# Patient Record
Sex: Female | Born: 1980 | Race: Black or African American | Hispanic: No | Marital: Single | Smoking: Current every day smoker
Health system: Southern US, Community
[De-identification: ages and names within clinical notes are randomized; demographics above are authoritative.]

## PROBLEM LIST (undated history)

## (undated) DIAGNOSIS — J189 Pneumonia, unspecified organism: Secondary | ICD-10-CM

## (undated) DIAGNOSIS — J939 Pneumothorax, unspecified: Secondary | ICD-10-CM

## (undated) DIAGNOSIS — Z9889 Other specified postprocedural states: Secondary | ICD-10-CM

## (undated) DIAGNOSIS — J9819 Other pulmonary collapse: Secondary | ICD-10-CM

## (undated) HISTORY — PX: CHEST TUBE INSERTION: SHX231

---

## 2015-07-08 DIAGNOSIS — Z8709 Personal history of other diseases of the respiratory system: Secondary | ICD-10-CM | POA: Insufficient documentation

## 2015-07-08 DIAGNOSIS — F17210 Nicotine dependence, cigarettes, uncomplicated: Secondary | ICD-10-CM | POA: Insufficient documentation

## 2015-07-08 DIAGNOSIS — D649 Anemia, unspecified: Secondary | ICD-10-CM | POA: Insufficient documentation

## 2015-07-08 DIAGNOSIS — E669 Obesity, unspecified: Secondary | ICD-10-CM | POA: Insufficient documentation

## 2015-07-08 DIAGNOSIS — F411 Generalized anxiety disorder: Secondary | ICD-10-CM | POA: Insufficient documentation

## 2015-11-04 DIAGNOSIS — R0789 Other chest pain: Secondary | ICD-10-CM | POA: Insufficient documentation

## 2015-11-04 DIAGNOSIS — I10 Essential (primary) hypertension: Secondary | ICD-10-CM | POA: Insufficient documentation

## 2018-01-25 DIAGNOSIS — S93432A Sprain of tibiofibular ligament of left ankle, initial encounter: Secondary | ICD-10-CM | POA: Insufficient documentation

## 2018-01-25 DIAGNOSIS — S82852A Displaced trimalleolar fracture of left lower leg, initial encounter for closed fracture: Secondary | ICD-10-CM | POA: Insufficient documentation

## 2019-05-14 ENCOUNTER — Emergency Department (HOSPITAL_COMMUNITY): Payer: Self-pay

## 2019-05-14 ENCOUNTER — Emergency Department (HOSPITAL_COMMUNITY)
Admission: EM | Admit: 2019-05-14 | Discharge: 2019-05-14 | Disposition: A | Discharge status: Home / Self Care | Payer: Self-pay | Attending: Emergency Medicine | Admitting: Emergency Medicine

## 2019-05-14 ENCOUNTER — Encounter (HOSPITAL_COMMUNITY): Payer: Self-pay | Admitting: *Deleted

## 2019-05-14 ENCOUNTER — Other Ambulatory Visit: Payer: Self-pay

## 2019-05-14 DIAGNOSIS — F17210 Nicotine dependence, cigarettes, uncomplicated: Secondary | ICD-10-CM | POA: Insufficient documentation

## 2019-05-14 DIAGNOSIS — I10 Essential (primary) hypertension: Secondary | ICD-10-CM | POA: Insufficient documentation

## 2019-05-14 DIAGNOSIS — Z8701 Personal history of pneumonia (recurrent): Secondary | ICD-10-CM | POA: Insufficient documentation

## 2019-05-14 DIAGNOSIS — R0789 Other chest pain: Secondary | ICD-10-CM | POA: Insufficient documentation

## 2019-05-14 DIAGNOSIS — Z20822 Contact with and (suspected) exposure to covid-19: Secondary | ICD-10-CM | POA: Insufficient documentation

## 2019-05-14 DIAGNOSIS — R0602 Shortness of breath: Secondary | ICD-10-CM | POA: Insufficient documentation

## 2019-05-14 HISTORY — DX: Pneumonia, unspecified organism: J18.9

## 2019-05-14 LAB — CBC WITH DIFFERENTIAL/PLATELET
Abs Immature Granulocytes: 0.04 10*3/uL (ref 0.00–0.07)
Basophils Absolute: 0.1 10*3/uL (ref 0.0–0.1)
Basophils Relative: 1 %
Eosinophils Absolute: 0.3 10*3/uL (ref 0.0–0.5)
Eosinophils Relative: 3 %
HCT: 41.4 % (ref 36.0–46.0)
Hemoglobin: 12.6 g/dL (ref 12.0–15.0)
Immature Granulocytes: 0 %
Lymphocytes Relative: 26 %
Lymphs Abs: 3.1 10*3/uL (ref 0.7–4.0)
MCH: 24.5 pg — ABNORMAL LOW (ref 26.0–34.0)
MCHC: 30.4 g/dL (ref 30.0–36.0)
MCV: 80.4 fL (ref 80.0–100.0)
Monocytes Absolute: 1.5 10*3/uL — ABNORMAL HIGH (ref 0.1–1.0)
Monocytes Relative: 12 %
Neutro Abs: 7.1 10*3/uL (ref 1.7–7.7)
Neutrophils Relative %: 58 %
Platelets: 349 10*3/uL (ref 150–400)
RBC: 5.15 MIL/uL — ABNORMAL HIGH (ref 3.87–5.11)
RDW: 17.9 % — ABNORMAL HIGH (ref 11.5–15.5)
WBC: 12.1 10*3/uL — ABNORMAL HIGH (ref 4.0–10.5)
nRBC: 0 % (ref 0.0–0.2)

## 2019-05-14 LAB — BASIC METABOLIC PANEL
Anion gap: 9 (ref 5–15)
BUN: 13 mg/dL (ref 6–20)
CO2: 26 mmol/L (ref 22–32)
Calcium: 9.2 mg/dL (ref 8.9–10.3)
Chloride: 103 mmol/L (ref 98–111)
Creatinine, Ser: 0.84 mg/dL (ref 0.44–1.00)
GFR calc Af Amer: >60 mL/min (ref >60)
GFR calc non Af Amer: >60 mL/min (ref >60)
Glucose, Bld: 85 mg/dL (ref 70–99)
Potassium: 3.9 mmol/L (ref 3.5–5.1)
Sodium: 138 mmol/L (ref 135–145)

## 2019-05-14 LAB — I-STAT BETA HCG BLOOD, ED (MC, WL, AP ONLY): I-stat hCG, quantitative: <5 m[IU]/mL (ref <5)

## 2019-05-14 LAB — TROPONIN I (HIGH SENSITIVITY): Troponin I (High Sensitivity): 5 ng/L (ref <18)

## 2019-05-14 LAB — BRAIN NATRIURETIC PEPTIDE: B Natriuretic Peptide: 40 pg/mL (ref 0.0–100.0)

## 2019-05-14 MED ORDER — MORPHINE SULFATE (PF) 4 MG/ML IV SOLN
4.0000 mg | Freq: Once | INTRAVENOUS | Status: AC
  Administered 2019-05-14: 4 mg via INTRAVENOUS
  Filled 2019-05-14: qty 1

## 2019-05-14 MED ORDER — SODIUM CHLORIDE 0.9 % IV BOLUS: 1000.0000 mL | Freq: Once | INTRAVENOUS | Status: DC

## 2019-05-14 MED ORDER — DOXYCYCLINE HYCLATE 100 MG PO CAPS: 100.0000 mg | ORAL_CAPSULE | Freq: Two times a day (BID) | ORAL | 0 refills | Status: DC

## 2019-05-14 MED ORDER — TRAMADOL HCL 50 MG PO TABS: 50.0000 mg | ORAL_TABLET | Freq: Four times a day (QID) | ORAL | 0 refills | Status: DC | PRN

## 2019-05-14 MED ORDER — ALBUTEROL SULFATE (2.5 MG/3ML) 0.083% IN NEBU: 5.0000 mg | INHALATION_SOLUTION | Freq: Once | RESPIRATORY_TRACT | Status: DC

## 2019-05-14 NOTE — ED Triage Notes (Signed)
Pt dx with bil Pneumonia a month ago, has taken antibiotics without note improvement, last few days noted to be worsening. Today felt as if heart was racing

## 2019-05-14 NOTE — Discharge Instructions (Signed)
Continue albuterol as needed for shortness of breath and wheezing Take Doxycycline twice daily for one week Take Tramadol as needed for pain Please quarantine until you get the results of your COVID test which could take 2-3 days Follow up with [facility] Please return if worsening

## 2019-05-14 NOTE — ED Provider Notes (Signed)
[facility]-EMERGENCY DEPT Provider Note   CSN: [id number] Arrival date & time: 05/14/19  1727     History Chief Complaint  Patient presents with  . Shortness of Breath    Suzanne Morrison is a 39 y.o. female with history of HTN, hx of pneumothorax and empyema requiring chest tube and VATs in 2017, smoking who presents with SOB.  Patient states that for the past month she has had gradually worsening shortness of breath.  Shortness of breath is all the time. Occasional wheezing. No significant cough. She reports associated chest soreness over the central part of her chest and over the bilateral ribs.  She is also experiencing upper back pain.  She states the pain feels like when she has had pneumonia in the past however she went to [facility] on 03/22/19 and was told she had a multifocal pneumonia and possible enlarged heart.  She was given a Z-Pak and states that she took the full course but never felt like she got better.  She continues to smoke cigarettes but states she only smokes 1-2 a day.  Covid test was sent off and was negative.  She uses albuterol as needed for shortness of breath which sometimes helps but also causes palpitations and heart racing and this is what caused her to come to the ED tonight.  She does not have a primary care doctor. No recent surgery/travel/immobilization, hx of cancer, leg swelling, hemoptysis, prior DVT/PE, or hormone use.    HPI     Past Medical History:  Diagnosis Date  . Pneumonia     There are no problems to display for this patient.   Past Surgical History:  Procedure Laterality Date  . CHEST TUBE INSERTION       OB History   No obstetric history on file.     No family history on file.  Social History   Tobacco Use  . Smoking status: Current Every Day Smoker  . Smokeless tobacco: Never Used  Substance Use Topics  . Alcohol use: Yes  . Drug use: Never    Home Medications Prior to Admission medications   Not  on File    Allergies    Patient has no known allergies.  Review of Systems   Review of Systems  Constitutional: Negative for chills and fever.  Respiratory: Positive for shortness of breath. Negative for cough and wheezing.   Cardiovascular: Positive for chest pain.  Gastrointestinal: Negative for abdominal pain, nausea and vomiting.  Genitourinary: Negative for dysuria.  Musculoskeletal: Positive for back pain.  Neurological: Negative for light-headedness and headaches.  All other systems reviewed and are negative.   Physical Exam Updated Vital Signs BP (!) 142/90 (BP Location: Left Arm)   Pulse 95   Temp 97.9 F (36.6 C) (Oral)   Resp (!) 25   Ht 6\' 1"  (1.854 m)   Wt 111.1 kg   SpO2 100%   BMI 32.32 kg/m   Physical Exam Vitals and nursing note reviewed.  Constitutional:      General: She is not in acute distress.    Appearance: She is well-developed. She is not ill-appearing.     Comments: Cooperative. NAD. Tearful  HENT:     Head: Normocephalic and atraumatic.  Eyes:     General: No scleral icterus.       Right eye: No discharge.        Left eye: No discharge.     Conjunctiva/sclera: Conjunctivae normal.     Pupils: Pupils  are equal, round, and reactive to light.  Cardiovascular:     Rate and Rhythm: Normal rate and regular rhythm.  Pulmonary:     Effort: Pulmonary effort is normal. No respiratory distress.     Breath sounds: Normal breath sounds.  Abdominal:     General: There is no distension.  Musculoskeletal:     Cervical back: Normal range of motion.     Right lower leg: No edema.     Left lower leg: No edema.  Skin:    General: Skin is warm and dry.  Neurological:     Mental Status: She is alert and oriented to person, place, and time.  Psychiatric:        Behavior: Behavior normal.     ED Results / Procedures / Treatments   Labs (all labs ordered are listed, but only abnormal results are displayed) Labs Reviewed  CBC WITH  DIFFERENTIAL/PLATELET - Abnormal; Notable for the following components:      Result Value   WBC 12.1 (*)    RBC 5.15 (*)    MCH 24.5 (*)    RDW 17.9 (*)    Monocytes Absolute 1.5 (*)    All other components within normal limits  NOVEL CORONAVIRUS, NAA (HOSP ORDER, SEND-OUT TO REF LAB; TAT 18-24 HRS)  BASIC METABOLIC PANEL  BRAIN NATRIURETIC PEPTIDE  I-STAT BETA HCG BLOOD, ED ([facility], [facility], [facility] ONLY)  TROPONIN I (HIGH SENSITIVITY)  TROPONIN I (HIGH SENSITIVITY)    EKG EKG Interpretation  Date/Time:  Sunday May 14 2019 18:28:54 EST Ventricular Rate:  89 PR Interval:    QRS Duration: 85 QT Interval:  367 QTC Calculation: 447 R Axis:   -5 Text Interpretation: Sinus rhythm Probable left atrial enlargement RSR' in V1 or V2, probably normal variant Confirmed by Virgina Norfolk [phone]) on 05/14/2019 6:35:00 PM   Radiology DG Chest 2 View  Result Date: 05/14/2019 CLINICAL DATA:  Shortness of breath. EXAM: CHEST - 2 VIEW COMPARISON:  None. FINDINGS: The heart size and mediastinal contours are within normal limits. Both lungs are clear. There is mild to moderate severity dextroscoliosis of the lower thoracic spine. IMPRESSION: No active cardiopulmonary disease. Electronically Signed   By: Aram Candela M.D.   On: 05/14/2019 18:40    Procedures Procedures (including critical care time)  Medications Ordered in ED Medications  morphine 4 MG/ML injection 4 mg (4 mg Intravenous Given 05/14/19 2114)    ED Course  I have reviewed the triage vital signs and the nursing notes.  Pertinent labs & imaging results that were available during my care of the patient were reviewed by me and considered in my medical decision making (see chart for details).  39 year old female presents with over one month of SOB and chest "soreness". She states it feels like when she has had pneumonia in the past. She was treated for CAP with a Z-pack last month and she reports no improvement. She is an everyday  smoker as well. BP here is elevated but otherwise vitals are reassuring. EKG was obtained and is SR. Due to prolonged symptoms will obtain labs, CXR.  CBC is remarkable for mild leukocytosis (12.1). BMP is normal. BNP is normal. Initial trop is 5. CXR is negative. Due to unrevealing CXR and hx of recurrent bronchitis and CAP will obtain CT   Nursing has notified me that the patient would like to leave. I went to talk to her and she states that her mom is waiting in the car  and she feels like she can get work up done as an outpatient. She is asking for COVID testing which we will obtain but I don't have a high suspicion for this. She doesn't have a PCP and was given information for [facility]. Since she feels symptoms are the same as when she's had pneumonia and she has a leukocytosis will treat clinically with Doxy. She is also requesting pain control - will give Tramadol. She has an inhaler at home to use as needed. Advised return if worsening.  Suzanne Morrison was evaluated in Emergency Department on 05/15/2019 for the symptoms described in the history of present illness. She was evaluated in the context of the global COVID-19 pandemic, which necessitated consideration that the patient might be at risk for infection with the SARS-CoV-2 virus that causes COVID-19. Institutional protocols and algorithms that pertain to the evaluation of patients at risk for COVID-19 are in a state of rapid change based on information released by regulatory bodies including the CDC and federal and state organizations. These policies and algorithms were followed during the patient's care in the ED.   MDM Rules/Calculators/A&P                       Final Clinical Impression(s) / ED Diagnoses Final diagnoses:  Shortness of breath  Atypical chest pain    Rx / DC Orders ED Discharge Orders    None       Recardo Evangelist, PA-C 05/15/19 2052    Lennice Sites, DO 05/17/19 2035

## 2019-05-15 ENCOUNTER — Telehealth: Payer: Self-pay | Admitting: *Deleted

## 2019-05-15 NOTE — Telephone Encounter (Signed)
Pt called to check the status of her covid 19 test, that was done in the hospital. She is advised that it has not resulted. She voiced understanding.

## 2019-05-16 LAB — NOVEL CORONAVIRUS, NAA (HOSP ORDER, SEND-OUT TO REF LAB; TAT 18-24 HRS): SARS-CoV-2, NAA: NOT DETECTED

## 2019-11-17 ENCOUNTER — Emergency Department (HOSPITAL_COMMUNITY): Admission: EM | Admit: 2019-11-17 | Discharge: 2019-11-17 | Discharge status: Against Medical Advice | Payer: Self-pay

## 2019-11-17 NOTE — ED Notes (Signed)
Eloped from waiting area prior to triage.  

## 2020-01-22 ENCOUNTER — Emergency Department (HOSPITAL_COMMUNITY)
Admission: EM | Admit: 2020-01-22 | Discharge: 2020-01-22 | Disposition: A | Discharge status: Home / Self Care | Payer: Self-pay | Attending: Emergency Medicine | Admitting: Emergency Medicine

## 2020-01-22 ENCOUNTER — Encounter (HOSPITAL_COMMUNITY): Payer: Self-pay | Admitting: Emergency Medicine

## 2020-01-22 ENCOUNTER — Other Ambulatory Visit: Payer: Self-pay

## 2020-01-22 ENCOUNTER — Emergency Department (HOSPITAL_COMMUNITY): Payer: Self-pay

## 2020-01-22 DIAGNOSIS — R079 Chest pain, unspecified: Secondary | ICD-10-CM

## 2020-01-22 DIAGNOSIS — F17210 Nicotine dependence, cigarettes, uncomplicated: Secondary | ICD-10-CM | POA: Insufficient documentation

## 2020-01-22 DIAGNOSIS — R072 Precordial pain: Secondary | ICD-10-CM | POA: Insufficient documentation

## 2020-01-22 HISTORY — DX: Pneumothorax, unspecified: J93.9

## 2020-01-22 LAB — BASIC METABOLIC PANEL
Anion gap: 7 (ref 5–15)
BUN: 7 mg/dL (ref 6–20)
CO2: 24 mmol/L (ref 22–32)
Calcium: 8.5 mg/dL — ABNORMAL LOW (ref 8.9–10.3)
Chloride: 104 mmol/L (ref 98–111)
Creatinine, Ser: 0.68 mg/dL (ref 0.44–1.00)
GFR calc Af Amer: >60 mL/min (ref >60)
GFR calc non Af Amer: >60 mL/min (ref >60)
Glucose, Bld: 95 mg/dL (ref 70–99)
Potassium: 3.3 mmol/L — ABNORMAL LOW (ref 3.5–5.1)
Sodium: 135 mmol/L (ref 135–145)

## 2020-01-22 LAB — TROPONIN I (HIGH SENSITIVITY): Troponin I (High Sensitivity): 5 ng/L (ref <18)

## 2020-01-22 LAB — CBC
HCT: 37.6 % (ref 36.0–46.0)
Hemoglobin: 11.9 g/dL — ABNORMAL LOW (ref 12.0–15.0)
MCH: 25.8 pg — ABNORMAL LOW (ref 26.0–34.0)
MCHC: 31.6 g/dL (ref 30.0–36.0)
MCV: 81.6 fL (ref 80.0–100.0)
Platelets: 282 10*3/uL (ref 150–400)
RBC: 4.61 MIL/uL (ref 3.87–5.11)
RDW: 16.7 % — ABNORMAL HIGH (ref 11.5–15.5)
WBC: 7.1 10*3/uL (ref 4.0–10.5)
nRBC: 0 % (ref 0.0–0.2)

## 2020-01-22 LAB — I-STAT BETA HCG BLOOD, ED (NOT ORDERABLE): I-stat hCG, quantitative: <5 m[IU]/mL (ref <5)

## 2020-01-22 NOTE — ED Provider Notes (Signed)
[facility]-EMERGENCY DEPT Provider Note   CSN: [id number] Arrival date & time: 01/22/20  0014     History Chief Complaint  Patient presents with  . Chest Pain    Suzanne Morrison is a 39 y.o. female.  Patient is a 39 year old female with past medical history of spontaneous pneumothorax 2 years ago requiring chest tube. She presents today for evaluation of chest discomfort. She describes a constant sharp pain in the left front of her chest that has been there for the past 3 days. It is worse when she breathes, moves, and lies on her left side. She denies any fevers, chills, or cough. She denies any diaphoresis, nausea, or radiation to the arm or jaw. She denies any recent exertional symptoms. She has no prior cardiac history.  The history is provided by the patient.  Chest Pain Pain location:  L chest and substernal area Pain quality: sharp   Pain radiates to:  Mid back Pain severity:  Moderate Onset quality:  Gradual Duration:  3 days Timing:  Constant Progression:  Worsening Chronicity:  New Relieved by:  Nothing Worsened by:  Movement and certain positions      Past Medical History:  Diagnosis Date  . Pneumonia   . Pneumothorax    Right    There are no problems to display for this patient.   Past Surgical History:  Procedure Laterality Date  . CHEST TUBE INSERTION       OB History   No obstetric history on file.     History reviewed. No pertinent family history.  Social History   Tobacco Use  . Smoking status: Current Every Day Smoker    Packs/day: 0.50    Types: Cigarettes  . Smokeless tobacco: Never Used  Substance Use Topics  . Alcohol use: Yes  . Drug use: Never    Home Medications Prior to Admission medications   Medication Sig Start Date End Date Taking? Authorizing Provider  doxycycline (VIBRAMYCIN) 100 MG capsule Take 1 capsule (100 mg total) by mouth 2 (two) times daily. 05/14/19   Bethel Born, PA-C    traMADol (ULTRAM) 50 MG tablet Take 1 tablet (50 mg total) by mouth every 6 (six) hours as needed. 05/14/19   Bethel Born, PA-C    Allergies    Patient has no known allergies.  Review of Systems   Review of Systems  Cardiovascular: Positive for chest pain.  All other systems reviewed and are negative.   Physical Exam Updated Vital Signs BP (!) 141/95 (BP Location: Right Arm)   Pulse 98   Temp 98.9 F (37.2 C) (Oral)   Resp (!) 28   Ht 6\' 1"  (1.854 m)   Wt 111.1 kg   LMP 01/15/2020   SpO2 100%   BMI 32.32 kg/m   Physical Exam Vitals and nursing note reviewed.  Constitutional:      General: She is not in acute distress.    Appearance: She is well-developed. She is not diaphoretic.  HENT:     Head: Normocephalic and atraumatic.  Cardiovascular:     Rate and Rhythm: Normal rate and regular rhythm.     Heart sounds: No murmur heard.  No friction rub. No gallop.   Pulmonary:     Effort: Pulmonary effort is normal. No respiratory distress.     Breath sounds: Normal breath sounds. No wheezing.  Abdominal:     General: Bowel sounds are normal. There is no distension.     Palpations:  Abdomen is soft.     Tenderness: There is no abdominal tenderness.  Musculoskeletal:        General: Normal range of motion.     Cervical back: Normal range of motion and neck supple.     Right lower leg: No tenderness. No edema.     Left lower leg: No tenderness. No edema.  Skin:    General: Skin is warm and dry.  Neurological:     Mental Status: She is alert and oriented to person, place, and time.     ED Results / Procedures / Treatments   Labs (all labs ordered are listed, but only abnormal results are displayed) Labs Reviewed  BASIC METABOLIC PANEL - Abnormal; Notable for the following components:      Result Value   Potassium 3.3 (*)    Calcium 8.5 (*)    All other components within normal limits  CBC - Abnormal; Notable for the following components:   Hemoglobin 11.9  (*)    MCH 25.8 (*)    RDW 16.7 (*)    All other components within normal limits  I-STAT BETA HCG BLOOD, ED ([facility], [facility], [facility] ONLY)  I-STAT BETA HCG BLOOD, ED (NOT ORDERABLE)  TROPONIN I (HIGH SENSITIVITY)  TROPONIN I (HIGH SENSITIVITY)    EKG EKG Interpretation  Date/Time:  Monday January 22 2020 00:41:05 EDT Ventricular Rate:  103 PR Interval:    QRS Duration: 89 QT Interval:  357 QTC Calculation: 468 R Axis:   20 Text Interpretation: Sinus tachycardia Probable left atrial enlargement Low voltage, precordial leads RSR' in V1 or V2, probably normal variant No significant change since 05/14/2019 Confirmed by Geoffery Lyons ([id number]) on 01/22/2020 3:00:43 AM   Radiology DG Chest 2 View  Result Date: 01/22/2020 CLINICAL DATA:  Chest pain EXAM: CHEST - 2 VIEW COMPARISON:  05/14/2019 FINDINGS: The heart size and mediastinal contours are within normal limits. Both lungs are clear. The visualized skeletal structures are unremarkable. IMPRESSION: No active cardiopulmonary disease. Electronically Signed   By: Deatra Robinson M.D.   On: 01/22/2020 02:38    Procedures Procedures (including critical care time)  Medications Ordered in ED Medications - No data to display  ED Course  I have reviewed the triage vital signs and the nursing notes.  Pertinent labs & imaging results that were available during my care of the patient were reviewed by me and considered in my medical decision making (see chart for details).    MDM Rules/Calculators/A&P  Patient presenting here with complaints of chest discomfort that is atypical for cardiac pain. I suspect a musculoskeletal etiology. Her work-up is negative and pain is worse when she breathes and lies in certain positions.  At this point, I feel as though discharge is appropriate. Patient was offered CTA of the chest to rule out PE, however declines. I have low suspicion for this.  Patient is to return as needed if symptoms worsen.  Final Clinical  Impression(s) / ED Diagnoses Final diagnoses:  None    Rx / DC Orders ED Discharge Orders    None       Geoffery Lyons, MD 01/22/20 [phone]

## 2020-01-22 NOTE — Discharge Instructions (Addendum)
Take ibuprofen 600 mg every 6 hours for the next 3 days.  Rest.  Return to the emergency department if you develop worsening pain, difficulty breathing, high fever, or other new and concerning symptoms.

## 2020-01-22 NOTE — ED Triage Notes (Signed)
Pt reports having increased chest pain on left side and into back. Pt reports pain when laying flat with difficulty breathing.

## 2020-08-05 ENCOUNTER — Emergency Department (HOSPITAL_COMMUNITY): Payer: Self-pay

## 2020-08-05 ENCOUNTER — Emergency Department (HOSPITAL_COMMUNITY)
Admission: EM | Admit: 2020-08-05 | Discharge: 2020-08-06 | Disposition: A | Discharge status: Home / Self Care | Payer: Self-pay | Attending: Emergency Medicine | Admitting: Emergency Medicine

## 2020-08-05 ENCOUNTER — Other Ambulatory Visit: Payer: Self-pay

## 2020-08-05 ENCOUNTER — Encounter (HOSPITAL_COMMUNITY): Payer: Self-pay

## 2020-08-05 DIAGNOSIS — M791 Myalgia, unspecified site: Secondary | ICD-10-CM

## 2020-08-05 DIAGNOSIS — R0781 Pleurodynia: Secondary | ICD-10-CM | POA: Insufficient documentation

## 2020-08-05 DIAGNOSIS — M549 Dorsalgia, unspecified: Secondary | ICD-10-CM | POA: Insufficient documentation

## 2020-08-05 DIAGNOSIS — R0981 Nasal congestion: Secondary | ICD-10-CM | POA: Insufficient documentation

## 2020-08-05 DIAGNOSIS — F17210 Nicotine dependence, cigarettes, uncomplicated: Secondary | ICD-10-CM | POA: Insufficient documentation

## 2020-08-05 DIAGNOSIS — R0602 Shortness of breath: Secondary | ICD-10-CM | POA: Insufficient documentation

## 2020-08-05 MED ORDER — ACETAMINOPHEN 325 MG PO TABS
650.0000 mg | ORAL_TABLET | Freq: Once | ORAL | Status: AC
  Administered 2020-08-06: 650 mg via ORAL
  Filled 2020-08-05: qty 2

## 2020-08-05 MED ORDER — OXYCODONE-ACETAMINOPHEN 5-325 MG PO TABS
1.0000 | ORAL_TABLET | Freq: Once | ORAL | Status: AC
  Administered 2020-08-06: 1 via ORAL
  Filled 2020-08-05: qty 1

## 2020-08-05 NOTE — ED Triage Notes (Signed)
Pt reports shob and "lung pains" that are worse on the left side that has been ongoing for 1 week.

## 2020-08-05 NOTE — ED Provider Notes (Signed)
[facility]-EMERGENCY DEPT Provider Note   CSN: [id number] Arrival date & time: 08/05/20  2247     History Chief Complaint  Patient presents with  . Shortness of Breath    Suzanne Morrison is a 40 y.o. female.  HPI Patient is a 40 year old female presenting today with 1 week of left-sided back and rib cage pain.  She also states that she occasionally has right-sided back pain.  She denies any fevers chills nausea vomiting.  She states that she feels somewhat short of breath because of the pain but states that she is not short of breath with exertion denies any cough or hemoptysis.  She states she has had some runny nose over the past few days.  Denies any sore throat, lightheadedness or dizziness.  No other associate symptoms.  She describes the pain as achy, constant, worse with movement.  Does not seem to have any other aggravating or mitigating factors.  She has tried Tylenol once daily for pain.  She states is been inadequate.  She has also tried ibuprofen twice daily which is also been inadequate.    Past Medical History:  Diagnosis Date  . Pneumonia   . Pneumothorax    Right    There are no problems to display for this patient.   Past Surgical History:  Procedure Laterality Date  . CHEST TUBE INSERTION       OB History   No obstetric history on file.     No family history on file.  Social History   Tobacco Use  . Smoking status: Current Every Day Smoker    Packs/day: 0.50    Types: Cigarettes  . Smokeless tobacco: Never Used  Substance Use Topics  . Alcohol use: Yes  . Drug use: Never    Home Medications Prior to Admission medications   Medication Sig Start Date End Date Taking? Authorizing Provider  methocarbamol (ROBAXIN) 500 MG tablet Take 1 tablet (500 mg total) by mouth 2 (two) times daily for 14 days. 08/06/20 08/20/20  Gailen Shelter, PA    Allergies    Propoxyphene  Review of Systems   Review of Systems   Constitutional: Negative for chills and fever.  HENT: Negative for congestion.   Eyes: Negative for pain.  Respiratory: Positive for shortness of breath (Due to pain). Negative for cough.   Cardiovascular: Negative for chest pain and leg swelling.  Gastrointestinal: Negative for abdominal pain and vomiting.  Genitourinary: Negative for dysuria.  Musculoskeletal: Positive for back pain and myalgias.  Skin: Negative for rash.  Neurological: Negative for dizziness and headaches.    Physical Exam Updated Vital Signs BP (!) 141/78   Pulse 84   Temp 98.5 F (36.9 C) (Oral)   Resp 18   Ht 6\' 1"  (1.854 m)   Wt 117.9 kg   SpO2 100%   BMI 34.30 kg/m   Physical Exam Vitals and nursing note reviewed.  Constitutional:      General: She is not in acute distress.    Comments: Tearful but well-appearing 40 year old female.  Appears uncomfortable.  Is nontoxic, nondiaphoretic, not ill-appearing  HENT:     Head: Normocephalic and atraumatic.     Nose: Nose normal.  Eyes:     General: No scleral icterus. Cardiovascular:     Rate and Rhythm: Normal rate and regular rhythm.     Pulses: Normal pulses.     Heart sounds: Normal heart sounds.     Comments: Bilateral radial artery pulses 3+  and symmetric Pulmonary:     Effort: Pulmonary effort is normal. No respiratory distress.     Breath sounds: No wheezing.  Chest:     Chest wall: Tenderness present.  Abdominal:     Palpations: Abdomen is soft.     Tenderness: There is no abdominal tenderness. There is no guarding or rebound.  Musculoskeletal:       Arms:     Cervical back: Normal range of motion.     Right lower leg: No edema.     Left lower leg: No edema.  Skin:    General: Skin is warm and dry.     Capillary Refill: Capillary refill takes less than 2 seconds.  Neurological:     Mental Status: She is alert. Mental status is at baseline.  Psychiatric:        Mood and Affect: Mood normal.        Behavior: Behavior normal.      ED Results / Procedures / Treatments   Labs (all labs ordered are listed, but only abnormal results are displayed) Labs Reviewed  BASIC METABOLIC PANEL - Abnormal; Notable for the following components:      Result Value   Calcium 8.7 (*)    All other components within normal limits  CBC WITH DIFFERENTIAL/PLATELET - Abnormal; Notable for the following components:   Hemoglobin 11.3 (*)    MCH 24.6 (*)    RDW 16.9 (*)    All other components within normal limits  TROPONIN I (HIGH SENSITIVITY)    EKG EKG Interpretation  Date/Time:  Monday August 05 2020 23:34:23 EDT Ventricular Rate:  82 PR Interval:  157 QRS Duration: 89 QT Interval:  410 QTC Calculation: 479 R Axis:   0 Text Interpretation: Sinus rhythm ST elev, probable normal early repol pattern No significant change since last tracing Confirmed by [name] [phone]) on 08/05/2020 11:37:33 PM   Radiology DG Chest 2 View  Result Date: 08/05/2020 CLINICAL DATA:  Shortness of breath and chest pain for 1 week, initial encounter EXAM: CHEST - 2 VIEW COMPARISON:  01/22/2020 FINDINGS: The heart size and mediastinal contours are within normal limits. Both lungs are clear. The visualized skeletal structures are unremarkable. IMPRESSION: No active cardiopulmonary disease. Electronically Signed   By: Alcide Clever M.D.   On: 08/05/2020 23:33    Procedures Procedures   Medications Ordered in ED Medications  oxyCODONE-acetaminophen (PERCOCET/ROXICET) 5-325 MG per tablet 1 tablet (1 tablet Oral Given 08/06/20 0011)  acetaminophen (TYLENOL) tablet 650 mg (650 mg Oral Given 08/06/20 0011)    ED Course  I have reviewed the triage vital signs and the nursing notes.  Pertinent labs & imaging results that were available during my care of the patient were reviewed by me and considered in my medical decision making (see chart for details).    MDM Rules/Calculators/A&P                          Patient presented to the ER today  tearful and concerned about her lungs due to some left thoracic back pain which is reproducible on my examination.  She seems to have some muscular spasm and has trigger points in left thoracic back.  There is no midline tenderness.  She has no recent trauma.  She is overall well-appearing has good lower extremity strength and is ambulatory.  She has a history of spontaneous pneumothorax continues to smoke half a pack daily.  Chest x-ray obtained  which is negative for pneumothorax. CBC without leukocytosis or significant anemia.  BMP unremarkable.  Troponin x1 within normal limits and EKG without evidence of ischemia.  There is some BER.  Patient reevaluated.  She feels significantly improved after Tylenol and oxycodone.  Will discharge home with muscle relaxer.  She is agreeable to plan.  We will follow-up with PCP.  Final Clinical Impression(s) / ED Diagnoses Final diagnoses:  Muscle pain    Rx / DC Orders ED Discharge Orders         Ordered    methocarbamol (ROBAXIN) 500 MG tablet  2 times daily,   Status:  Discontinued        08/06/20 0200    methocarbamol (ROBAXIN) 500 MG tablet  2 times daily        08/06/20 0200           Gailen Shelter, [place] 08/06/20 0235    Shon Baton, MD 08/06/20 [phone]

## 2020-08-06 LAB — BASIC METABOLIC PANEL
Anion gap: 10 (ref 5–15)
BUN: 6 mg/dL (ref 6–20)
CO2: 24 mmol/L (ref 22–32)
Calcium: 8.7 mg/dL — ABNORMAL LOW (ref 8.9–10.3)
Chloride: 104 mmol/L (ref 98–111)
Creatinine, Ser: 0.76 mg/dL (ref 0.44–1.00)
GFR, Estimated: >60 mL/min (ref >60)
Glucose, Bld: 94 mg/dL (ref 70–99)
Potassium: 3.6 mmol/L (ref 3.5–5.1)
Sodium: 138 mmol/L (ref 135–145)

## 2020-08-06 LAB — CBC WITH DIFFERENTIAL/PLATELET
Abs Immature Granulocytes: 0.04 10*3/uL (ref 0.00–0.07)
Basophils Absolute: 0.1 10*3/uL (ref 0.0–0.1)
Basophils Relative: 1 %
Eosinophils Absolute: 0.4 10*3/uL (ref 0.0–0.5)
Eosinophils Relative: 3 %
HCT: 37.4 % (ref 36.0–46.0)
Hemoglobin: 11.3 g/dL — ABNORMAL LOW (ref 12.0–15.0)
Immature Granulocytes: 0 %
Lymphocytes Relative: 29 %
Lymphs Abs: 3 10*3/uL (ref 0.7–4.0)
MCH: 24.6 pg — ABNORMAL LOW (ref 26.0–34.0)
MCHC: 30.2 g/dL (ref 30.0–36.0)
MCV: 81.5 fL (ref 80.0–100.0)
Monocytes Absolute: 0.9 10*3/uL (ref 0.1–1.0)
Monocytes Relative: 9 %
Neutro Abs: 5.9 10*3/uL (ref 1.7–7.7)
Neutrophils Relative %: 58 %
Platelets: 351 10*3/uL (ref 150–400)
RBC: 4.59 MIL/uL (ref 3.87–5.11)
RDW: 16.9 % — ABNORMAL HIGH (ref 11.5–15.5)
WBC: 10.2 10*3/uL (ref 4.0–10.5)
nRBC: 0 % (ref 0.0–0.2)

## 2020-08-06 LAB — TROPONIN I (HIGH SENSITIVITY): Troponin I (High Sensitivity): 6 ng/L (ref <18)

## 2020-08-06 MED ORDER — METHOCARBAMOL 500 MG PO TABS: 500.0000 mg | ORAL_TABLET | Freq: Two times a day (BID) | ORAL | 0 refills | Status: AC

## 2020-08-06 MED ORDER — METHOCARBAMOL 500 MG PO TABS: 500.0000 mg | ORAL_TABLET | Freq: Two times a day (BID) | ORAL | 0 refills | Status: DC

## 2020-08-06 NOTE — Discharge Instructions (Signed)
Your examination today is most concerning for a muscular injury ?1. Medications: alternate ibuprofen and tylenol for pain control, take all usual home medications as they are prescribed ?2. Treatment: rest, ice, elevate and use an ACE wrap or other compressive therapy to decrease swelling. Also drink plenty of fluids and do plenty of gentle stretching and move the affected muscle through its normal range of motion to prevent stiffness. ?3. Follow Up: If your symptoms do not improve please follow up with orthopedics/sports medicine or your PCP for discussion of your diagnoses and further evaluation after today's visit; if you do not have a primary care doctor use the resource guide provided to find one; Please return to the ER for worsening symptoms or other concerns.  ? ? ?Please use Tylenol or ibuprofen for pain.  You may use 600 mg ibuprofen every 6 hours or 1000 mg of Tylenol every 6 hours.  You may choose to alternate between the 2.  This would be most effective.  Not to exceed 4 g of Tylenol within 24 hours.  Not to exceed 3200 mg ibuprofen 24 hours.   ?

## 2020-09-26 ENCOUNTER — Other Ambulatory Visit: Payer: Self-pay

## 2020-09-26 ENCOUNTER — Emergency Department (HOSPITAL_COMMUNITY)
Admission: EM | Admit: 2020-09-26 | Discharge: 2020-09-26 | Disposition: A | Discharge status: Home / Self Care | Payer: Self-pay | Attending: Emergency Medicine | Admitting: Emergency Medicine

## 2020-09-26 ENCOUNTER — Emergency Department (HOSPITAL_COMMUNITY): Payer: Self-pay

## 2020-09-26 ENCOUNTER — Encounter (HOSPITAL_COMMUNITY): Payer: Self-pay

## 2020-09-26 DIAGNOSIS — Z20822 Contact with and (suspected) exposure to covid-19: Secondary | ICD-10-CM | POA: Insufficient documentation

## 2020-09-26 DIAGNOSIS — J069 Acute upper respiratory infection, unspecified: Secondary | ICD-10-CM | POA: Insufficient documentation

## 2020-09-26 DIAGNOSIS — F17210 Nicotine dependence, cigarettes, uncomplicated: Secondary | ICD-10-CM | POA: Insufficient documentation

## 2020-09-26 DIAGNOSIS — M545 Low back pain, unspecified: Secondary | ICD-10-CM | POA: Insufficient documentation

## 2020-09-26 DIAGNOSIS — Z28311 Partially vaccinated for covid-19: Secondary | ICD-10-CM | POA: Insufficient documentation

## 2020-09-26 LAB — URINALYSIS, ROUTINE W REFLEX MICROSCOPIC: RBC / HPF: >50 RBC/hpf — ABNORMAL HIGH (ref 0–5)

## 2020-09-26 LAB — RESP PANEL BY RT-PCR (FLU A&B, COVID) ARPGX2
Influenza A by PCR: NEGATIVE
Influenza B by PCR: NEGATIVE
SARS Coronavirus 2 by RT PCR: NEGATIVE

## 2020-09-26 MED ORDER — PREDNISONE 20 MG PO TABS: 40.0000 mg | ORAL_TABLET | Freq: Every day | ORAL | 0 refills | Status: AC

## 2020-09-26 MED ORDER — LIDOCAINE 5 % EX PTCH
2.0000 | MEDICATED_PATCH | CUTANEOUS | Status: DC
  Administered 2020-09-26: 2 via TRANSDERMAL
  Filled 2020-09-26: qty 2

## 2020-09-26 MED ORDER — IBUPROFEN 800 MG PO TABS
800.0000 mg | ORAL_TABLET | Freq: Once | ORAL | Status: DC
  Filled 2020-09-26: qty 1

## 2020-09-26 MED ORDER — ACETAMINOPHEN ER 650 MG PO TBCR: 650.0000 mg | EXTENDED_RELEASE_TABLET | Freq: Three times a day (TID) | ORAL | 0 refills | Status: DC | PRN

## 2020-09-26 MED ORDER — METHOCARBAMOL 500 MG PO TABS: 500.0000 mg | ORAL_TABLET | Freq: Two times a day (BID) | ORAL | 0 refills | Status: DC

## 2020-09-26 MED ORDER — ALBUTEROL SULFATE HFA 108 (90 BASE) MCG/ACT IN AERS
4.0000 | INHALATION_SPRAY | Freq: Once | RESPIRATORY_TRACT | Status: AC
  Administered 2020-09-26: 4 via RESPIRATORY_TRACT
  Filled 2020-09-26: qty 6.7

## 2020-09-26 MED ORDER — ALBUTEROL SULFATE HFA 108 (90 BASE) MCG/ACT IN AERS: 2.0000 | INHALATION_SPRAY | Freq: Four times a day (QID) | RESPIRATORY_TRACT | 0 refills | Status: AC | PRN

## 2020-09-26 MED ORDER — AEROCHAMBER Z-STAT PLUS/MEDIUM MISC: 1.0000 | Freq: Once | Status: DC

## 2020-09-26 NOTE — ED Triage Notes (Signed)
Patient c/o nasal congestion and cough x 5 days. Patient also c/o bilateral lower back pain x 2 days.

## 2020-09-26 NOTE — Discharge Instructions (Addendum)
It was a pleasure taking care of you today.  As discussed, I suspect your symptoms are related to a viral infection.  Your COVID and influenza test are negative.  Your chest x-ray did not show signs of pneumonia. I am sending you home with steroids and albuterol for your wheeze. For you back pain, I am sending you home with pain medication and a muscle relaxer.  Muscle relaxer can cause drowsiness so do not drive or operate Machinery while on the medication.  You may also purchase over-the-counter Lidoderm patches and Voltaren gel for added pain relief.  Please follow-up with PCP if symptoms do not improved within the next week.  Return to the ER for new or worsening symptoms.

## 2020-09-26 NOTE — ED Provider Notes (Signed)
[facility]-EMERGENCY DEPT Provider Note   CSN: [id number] Arrival date & time: 09/26/20  4132     History Chief Complaint  Patient presents with   Nasal Congestion   Back Pain   Cough    Suzanne Morrison is a 40 y.o. female with a past medical history significant for history of right pneumothorax who presents to the ED due to productive cough and nasal congestion x5 days.  Patient admits to a productive cough with white phlegm.  Denies associated shortness of breath.  She admits to chest pain only while coughing.  Admits to feeling hot however, denies any documented fevers.  Admits to nausea and 3 episodes of nonbloody diarrhea yesterday.  She has received her first COVID-vaccine.  Denies sick contacts and known COVID exposures.   Patient also endorses bilateral lumbar back pain that has been persistent for the past 2 days.  Patient states pain is worse with palpation and movement especially ambulation.  Denies any trauma.  She has been taking ibuprofen with moderate relief.  Back pain is nonradiating in nature.  Patient denies saddle paresthesias, bowel/bladder incontinence, lower extremity numbness/tingling, lower extremity weakness, IV drug use, fever/chills, and history of cancer.  History obtained from patient and past medical records. No interpreter used during encounter.       Past Medical History:  Diagnosis Date   Pneumonia    Pneumothorax    Right    There are no problems to display for this patient.   Past Surgical History:  Procedure Laterality Date   CHEST TUBE INSERTION       OB History   No obstetric history on file.     Family History  Problem Relation Age of Onset   Diabetes Mother    Healthy Father     Social History   Tobacco Use   Smoking status: Every Day    Packs/day: 0.50    Pack years: 0.00    Types: Cigarettes   Smokeless tobacco: Never  Vaping Use   Vaping Use: Never used  Substance Use Topics   Alcohol use:  Yes   Drug use: Never    Home Medications Prior to Admission medications   Medication Sig Start Date End Date Taking? Authorizing Provider  acetaminophen (TYLENOL 8 HOUR) 650 MG CR tablet Take 1 tablet (650 mg total) by mouth every 8 (eight) hours as needed for pain. 09/26/20  Yes Shia Delaine, Merla Riches, PA-C  albuterol (VENTOLIN HFA) 108 (90 Base) MCG/ACT inhaler Inhale 2 puffs into the lungs every 6 (six) hours as needed for wheezing or shortness of breath. 09/26/20  Yes Ellajane Stong, Merla Riches, PA-C  methocarbamol (ROBAXIN) 500 MG tablet Take 1 tablet (500 mg total) by mouth 2 (two) times daily. 09/26/20  Yes Kelvin Sennett, Merla Riches, PA-C  predniSONE (DELTASONE) 20 MG tablet Take 2 tablets (40 mg total) by mouth daily for 5 days. 09/26/20 10/01/20 Yes Alonda Weaber, Merla Riches, PA-C    Allergies    Propoxyphene  Review of Systems   Review of Systems  Constitutional:  Negative for chills and fever.  HENT:  Positive for congestion.   Respiratory:  Positive for cough. Negative for shortness of breath.   Cardiovascular:  Positive for chest pain (while coughing).  Gastrointestinal:  Positive for diarrhea and nausea. Negative for abdominal pain and vomiting.  Genitourinary:  Negative for dysuria.  Musculoskeletal:  Positive for back pain.  All other systems reviewed and are negative.  Physical Exam Updated Vital Signs BP (!) 160/113 (  BP Location: Right Arm)   Pulse 94   Temp 98.2 F (36.8 C) (Oral)   Resp 16   Ht 6\' 1"  (1.854 m)   Wt 113.4 kg   LMP 09/26/2020 (Exact Date)   SpO2 98%   BMI 32.98 kg/m   Physical Exam Vitals and nursing note reviewed.  Constitutional:      General: She is not in acute distress.    Appearance: She is not ill-appearing.  HENT:     Head: Normocephalic.  Eyes:     Pupils: Pupils are equal, round, and reactive to light.  Cardiovascular:     Rate and Rhythm: Normal rate and regular rhythm.     Pulses: Normal pulses.     Heart sounds: Normal heart sounds. No murmur  heard.   No friction rub. No gallop.  Pulmonary:     Effort: Pulmonary effort is normal.     Breath sounds: Wheezing present.     Comments: Expiratory wheeze heard throughout. Patient speaking in full sentences. No accessory muscle usage.  Abdominal:     General: Abdomen is flat. There is no distension.     Palpations: Abdomen is soft.     Tenderness: There is no abdominal tenderness. There is no guarding or rebound.  Musculoskeletal:        General: Normal range of motion.     Cervical back: Neck supple.     Comments: No thoracic or lumbar midline tenderness.  Reproducible bilateral lumbar paraspinal tenderness.  Lower extremities neurovascularly intact.  Patient able to ambulate in the ED without difficulty.  Skin:    General: Skin is warm and dry.  Neurological:     General: No focal deficit present.     Mental Status: She is alert.  Psychiatric:        Mood and Affect: Mood normal.        Behavior: Behavior normal.    ED Results / Procedures / Treatments   Labs (all labs ordered are listed, but only abnormal results are displayed) Labs Reviewed  URINALYSIS, ROUTINE W REFLEX MICROSCOPIC - Abnormal; Notable for the following components:      Result Value   Color, Urine BLOODY (*)    APPearance CLOUDY (*)    Glucose, UA   (*)    Value: TEST NOT REPORTED DUE TO COLOR INTERFERENCE OF URINE PIGMENT   Hgb urine dipstick   (*)    Value: TEST NOT REPORTED DUE TO COLOR INTERFERENCE OF URINE PIGMENT   Bilirubin Urine   (*)    Value: TEST NOT REPORTED DUE TO COLOR INTERFERENCE OF URINE PIGMENT   Ketones, ur   (*)    Value: TEST NOT REPORTED DUE TO COLOR INTERFERENCE OF URINE PIGMENT   Protein, ur   (*)    Value: TEST NOT REPORTED DUE TO COLOR INTERFERENCE OF URINE PIGMENT   Nitrite   (*)    Value: TEST NOT REPORTED DUE TO COLOR INTERFERENCE OF URINE PIGMENT   Leukocytes,Ua   (*)    Value: TEST NOT REPORTED DUE TO COLOR INTERFERENCE OF URINE PIGMENT   RBC / HPF >50 (*)     Bacteria, UA FEW (*)    All other components within normal limits  RESP PANEL BY RT-PCR (FLU A&B, COVID) [id number]  POC URINE PREG, ED    EKG None  Radiology DG Chest Portable 1 View  Result Date: 09/26/2020 CLINICAL DATA:  Cough. EXAM: PORTABLE CHEST 1 VIEW COMPARISON:  August 05, 2020. FINDINGS: The  heart size and mediastinal contours are within normal limits. Both lungs are clear. The visualized skeletal structures are unremarkable. IMPRESSION: No active disease. Electronically Signed   By: Lupita Raider M.D.   On: 09/26/2020 12:26    Procedures Procedures   Medications Ordered in ED Medications  aerochamber Z-Stat Plus/medium 1 each (has no administration in time range)  lidocaine (LIDODERM) 5 % 2 patch (2 patches Transdermal Patch Applied 09/26/20 1124)  ibuprofen (ADVIL) tablet 800 mg (800 mg Oral Patient Refused/Not Given 09/26/20 1124)  albuterol (VENTOLIN HFA) 108 (90 Base) MCG/ACT inhaler 4 puff (4 puffs Inhalation Given 09/26/20 1111)    ED Course  I have reviewed the triage vital signs and the nursing notes.  Pertinent labs & imaging results that were available during my care of the patient were reviewed by me and considered in my medical decision making (see chart for details).    MDM Rules/Calculators/A&P                         40 year old female presents to the ED due to nasal congestion and productive cough x5 days.  She also endorses bilateral, nonradiating low back pain x2 days.  No red flags.  Patient denies sick contacts and known COVID exposures.  She has received her first COVID-vaccine.  Patient afebrile, not tachycardic or hypoxic.  Patient nontoxic-appearing.  Physical exam significant for expiratory wheeze heard throughout. Bilateral lumbar paraspinal tenderness without thoracic or lumbar midline tenderness.  Lower extremities neurovascularly intact.  Low suspicion for cauda equina or central cord compression.  Suspect back pain related to myalgias secondary to  viral infection.  COVID/influenza test to rule out infection.  Chest x-ray to rule out pneumonia.  Patient requesting UA due to back pain however, low suspicion for pyelonephritis given no CVA tenderness and pain located in low back. Lidoderm patch and ibuprofen given for pain. Albuterol for wheeze.   Repeat lung exam without expiratory wheeze after albuterol. Will discharge with albuterol and steroids.  COVID/influenza negative.  Chest x-ray personally reviewed which is negative for signs of pneumonia, pneumothorax, or widened mediastinum.  EKG personally reviewed which demonstrates normal sinus rhythm and no signs of acute ischemia. Suspect symptoms related to viral etiology.  Patient discharged with symptomatic treatment and steroids given wheeze.  Encouraged patient to follow-up with PCP if symptoms not improved within the next week. Strict ED precautions discussed with patient. Patient states understanding and agrees to plan. Patient discharged home in no acute distress and stable vitals. Final Clinical Impression(s) / ED Diagnoses Final diagnoses:  Viral URI with cough  Acute bilateral low back pain without sciatica    Rx / DC Orders ED Discharge Orders          Ordered    predniSONE (DELTASONE) 20 MG tablet  Daily        09/26/20 1256    albuterol (VENTOLIN HFA) 108 (90 Base) MCG/ACT inhaler  Every 6 hours PRN        09/26/20 1256    methocarbamol (ROBAXIN) 500 MG tablet  2 times daily        09/26/20 1256    acetaminophen (TYLENOL 8 HOUR) 650 MG CR tablet  Every 8 hours PRN        09/26/20 1256             Mannie Stabile, PA-C 09/26/20 1258    Vanetta Mulders, MD 10/02/20 1614

## 2020-11-07 ENCOUNTER — Emergency Department (HOSPITAL_COMMUNITY)
Admission: EM | Admit: 2020-11-07 | Discharge: 2020-11-08 | Disposition: A | Discharge status: Home / Self Care | Payer: Self-pay | Attending: Emergency Medicine | Admitting: Emergency Medicine

## 2020-11-07 ENCOUNTER — Other Ambulatory Visit: Payer: Self-pay

## 2020-11-07 ENCOUNTER — Encounter (HOSPITAL_COMMUNITY): Payer: Self-pay | Admitting: *Deleted

## 2020-11-07 ENCOUNTER — Emergency Department (HOSPITAL_COMMUNITY): Payer: Self-pay

## 2020-11-07 DIAGNOSIS — F17210 Nicotine dependence, cigarettes, uncomplicated: Secondary | ICD-10-CM | POA: Insufficient documentation

## 2020-11-07 DIAGNOSIS — R1013 Epigastric pain: Secondary | ICD-10-CM | POA: Insufficient documentation

## 2020-11-07 DIAGNOSIS — R1011 Right upper quadrant pain: Secondary | ICD-10-CM | POA: Insufficient documentation

## 2020-11-07 DIAGNOSIS — R112 Nausea with vomiting, unspecified: Secondary | ICD-10-CM | POA: Insufficient documentation

## 2020-11-07 DIAGNOSIS — N9489 Other specified conditions associated with female genital organs and menstrual cycle: Secondary | ICD-10-CM | POA: Insufficient documentation

## 2020-11-07 DIAGNOSIS — R1033 Periumbilical pain: Secondary | ICD-10-CM | POA: Insufficient documentation

## 2020-11-07 LAB — URINALYSIS, ROUTINE W REFLEX MICROSCOPIC
Bacteria, UA: NONE SEEN
Bilirubin Urine: NEGATIVE
Glucose, UA: NEGATIVE mg/dL
Hgb urine dipstick: NEGATIVE
Ketones, ur: NEGATIVE mg/dL
Leukocytes,Ua: NEGATIVE
Nitrite: NEGATIVE
Protein, ur: 30 mg/dL — AB
Specific Gravity, Urine: 1.019 (ref 1.005–1.030)
pH: 6 (ref 5.0–8.0)

## 2020-11-07 LAB — CBC
HCT: 30.7 % — ABNORMAL LOW (ref 36.0–46.0)
Hemoglobin: 9.3 g/dL — ABNORMAL LOW (ref 12.0–15.0)
MCH: 23.5 pg — ABNORMAL LOW (ref 26.0–34.0)
MCHC: 30.3 g/dL (ref 30.0–36.0)
MCV: 77.7 fL — ABNORMAL LOW (ref 80.0–100.0)
Platelets: 391 10*3/uL (ref 150–400)
RBC: 3.95 MIL/uL (ref 3.87–5.11)
RDW: 16 % — ABNORMAL HIGH (ref 11.5–15.5)
WBC: 10.4 10*3/uL (ref 4.0–10.5)
nRBC: 0 % (ref 0.0–0.2)

## 2020-11-07 LAB — COMPREHENSIVE METABOLIC PANEL
ALT: 14 U/L (ref 0–44)
AST: 21 U/L (ref 15–41)
Albumin: 3.7 g/dL (ref 3.5–5.0)
Alkaline Phosphatase: 65 U/L (ref 38–126)
Anion gap: 11 (ref 5–15)
BUN: <5 mg/dL — ABNORMAL LOW (ref 6–20)
CO2: 24 mmol/L (ref 22–32)
Calcium: 8.5 mg/dL — ABNORMAL LOW (ref 8.9–10.3)
Chloride: 105 mmol/L (ref 98–111)
Creatinine, Ser: 0.69 mg/dL (ref 0.44–1.00)
GFR, Estimated: >60 mL/min (ref >60)
Glucose, Bld: 104 mg/dL — ABNORMAL HIGH (ref 70–99)
Potassium: 3.3 mmol/L — ABNORMAL LOW (ref 3.5–5.1)
Sodium: 140 mmol/L (ref 135–145)
Total Bilirubin: 0.3 mg/dL (ref 0.3–1.2)
Total Protein: 6.8 g/dL (ref 6.5–8.1)

## 2020-11-07 LAB — I-STAT BETA HCG BLOOD, ED (MC, WL, AP ONLY): I-stat hCG, quantitative: <5 m[IU]/mL (ref <5)

## 2020-11-07 LAB — LIPASE, BLOOD: Lipase: 23 U/L (ref 11–51)

## 2020-11-07 MED ORDER — MORPHINE SULFATE (PF) 4 MG/ML IV SOLN
4.0000 mg | Freq: Once | INTRAVENOUS | Status: AC
  Administered 2020-11-08: 4 mg via INTRAVENOUS
  Filled 2020-11-07: qty 1

## 2020-11-07 MED ORDER — ONDANSETRON HCL 4 MG/2ML IJ SOLN
4.0000 mg | Freq: Once | INTRAMUSCULAR | Status: AC
Start: 2020-11-07 — End: 2020-11-08
  Administered 2020-11-08: 4 mg via INTRAVENOUS
  Filled 2020-11-07: qty 2

## 2020-11-07 MED ORDER — SODIUM CHLORIDE 0.9 % IV BOLUS
1000.0000 mL | Freq: Once | INTRAVENOUS | Status: AC
  Administered 2020-11-08: 1000 mL via INTRAVENOUS

## 2020-11-07 NOTE — ED Notes (Signed)
Ultrasound bedside.

## 2020-11-07 NOTE — ED Triage Notes (Signed)
Pt complains of upper abdominal pain and vomiting x 1 week. Pain is worse right after eating.

## 2020-11-07 NOTE — ED Provider Notes (Signed)
Emergency Medicine Provider Triage Evaluation Note  Suzanne Morrison , a 40 y.o. female  was evaluated in triage.  Pt complains of epigastric and ruq pain x days. NBNB emesis. Worse with eating. No prior abd surgeries. Diarrhea, no melena or BRBPR. No CP. SOB. No chronic NSAID use, etoh use  Review of Systems  Positive: Epigastric pain Negative: CP, SOB, fever, HA  Physical Exam  BP (!) 160/111 (BP Location: Right Arm)   Pulse (!) 101   Temp (!) 97.5 F (36.4 C)   Resp 18   LMP 10/24/2020   SpO2 98%  Gen:   Awake, no distress   Resp:  Normal effort  MSK:   Moves extremities without difficulty  Abd:   Diffusely tender to epigastric region Other:    Medical Decision Making  Medically screening exam initiated at 4:23 PM.  Appropriate orders placed.  Suzanne Morrison was informed that the remainder of the evaluation will be completed by another provider, this initial triage assessment does not replace that evaluation, and the importance of remaining in the ED until their evaluation is complete.  Abd pain  VS stable, labs and imaging ordered   Suzanne Dibbles, PA-C 11/07/20 1625    Suzanne Fines, MD 11/08/20 [phone]

## 2020-11-08 LAB — POC OCCULT BLOOD, ED: Fecal Occult Bld: NEGATIVE

## 2020-11-08 MED ORDER — ONDANSETRON 4 MG PO TBDP: 4.0000 mg | ORAL_TABLET | Freq: Three times a day (TID) | ORAL | 0 refills | Status: DC | PRN

## 2020-11-08 MED ORDER — DICYCLOMINE HCL 20 MG PO TABS: 20.0000 mg | ORAL_TABLET | Freq: Two times a day (BID) | ORAL | 0 refills | Status: DC

## 2020-11-08 NOTE — Discharge Instructions (Addendum)
Follow up with [facility] for primary care evaluation of elevated blood pressure tonight to determine if ongoing treatment is needed for this.   Follow up with [facility] by calling the office to schedule a time to be seen for further evaluation of epigastric abdominal pain.   If you develop a high fever, severe pain, bloody vomit or stools, return to the emergency department for further evaluation.

## 2020-11-08 NOTE — ED Provider Notes (Signed)
[facility]-EMERGENCY DEPT Provider Note   CSN: [id number] Arrival date & time: 11/07/20  1607     History Chief Complaint  Patient presents with   Abdominal Pain    Suzanne Morrison is a 40 y.o. female.  Patient to ED with complaint of periumbilical and epigastric abdominal pain that started several days ago and is intermittent. She reports it occurs every time she eats but can occur when not eating. She has NBNB vomiting and reports 2 episodes a day of loose stool that does not appear melanic. No fever. No previous abdominal surgeries. No regular use of NSAIDs or alcohol. No history of similar symptoms or ulcers.   The history is provided by the patient. No language interpreter was used.  Abdominal Pain Associated symptoms: nausea and vomiting   Associated symptoms: no chills, no dysuria, no fever and no vaginal discharge       Past Medical History:  Diagnosis Date   Pneumonia    Pneumothorax    Right    There are no problems to display for this patient.   Past Surgical History:  Procedure Laterality Date   CHEST TUBE INSERTION       OB History   No obstetric history on file.     Family History  Problem Relation Age of Onset   Diabetes Mother    Healthy Father     Social History   Tobacco Use   Smoking status: Every Day    Packs/day: 0.50    Types: Cigarettes   Smokeless tobacco: Never  Vaping Use   Vaping Use: Never used  Substance Use Topics   Alcohol use: Yes   Drug use: Never    Home Medications Prior to Admission medications   Medication Sig Start Date End Date Taking? Authorizing Provider  acetaminophen (TYLENOL 8 HOUR) 650 MG CR tablet Take 1 tablet (650 mg total) by mouth every 8 (eight) hours as needed for pain. 09/26/20   Mannie Stabile, PA-C  albuterol (VENTOLIN HFA) 108 (90 Base) MCG/ACT inhaler Inhale 2 puffs into the lungs every 6 (six) hours as needed for wheezing or shortness of breath. 09/26/20   Mannie Stabile, PA-C  methocarbamol (ROBAXIN) 500 MG tablet Take 1 tablet (500 mg total) by mouth 2 (two) times daily. 09/26/20   Mannie Stabile, PA-C    Allergies    Propoxyphene  Review of Systems   Review of Systems  Constitutional:  Negative for chills and fever.  HENT: Negative.    Respiratory: Negative.    Cardiovascular: Negative.   Gastrointestinal:  Positive for abdominal pain, nausea and vomiting. Negative for blood in stool.  Genitourinary:  Negative for dysuria and vaginal discharge.  Musculoskeletal: Negative.   Skin: Negative.   Neurological: Negative.  Negative for syncope, weakness and light-headedness.   Physical Exam Updated Vital Signs BP (!) 152/111   Pulse 88   Temp 97.6 F (36.4 C) (Oral)   Resp 19   LMP 10/24/2020   SpO2 100%   Physical Exam Vitals and nursing note reviewed.  Constitutional:      Appearance: She is well-developed. She is obese.  HENT:     Head: Normocephalic.  Cardiovascular:     Rate and Rhythm: Normal rate and regular rhythm.     Heart sounds: No murmur heard. Pulmonary:     Effort: Pulmonary effort is normal.     Breath sounds: Normal breath sounds. No wheezing, rhonchi or rales.  Abdominal:  General: Bowel sounds are normal. There is no distension.     Palpations: Abdomen is soft.     Tenderness: There is abdominal tenderness in the epigastric area and periumbilical area. There is no guarding or rebound.  Musculoskeletal:        General: Normal range of motion.     Cervical back: Normal range of motion and neck supple.  Skin:    General: Skin is warm and dry.  Neurological:     General: No focal deficit present.     Mental Status: She is alert and oriented to person, place, and time.    ED Results / Procedures / Treatments   Labs (all labs ordered are listed, but only abnormal results are displayed) Labs Reviewed  COMPREHENSIVE METABOLIC PANEL - Abnormal; Notable for the following components:      Result Value    Potassium 3.3 (*)    Glucose, Bld 104 (*)    BUN <5 (*)    Calcium 8.5 (*)    All other components within normal limits  CBC - Abnormal; Notable for the following components:   Hemoglobin 9.3 (*)    HCT 30.7 (*)    MCV 77.7 (*)    MCH 23.5 (*)    RDW 16.0 (*)    All other components within normal limits  URINALYSIS, ROUTINE W REFLEX MICROSCOPIC - Abnormal; Notable for the following components:   Protein, ur 30 (*)    All other components within normal limits  LIPASE, BLOOD  I-STAT BETA HCG BLOOD, ED ([facility], [facility], [facility] ONLY)  POC OCCULT BLOOD, ED   Results for orders placed or performed during the hospital encounter of 11/07/20  Lipase, blood  Result Value Ref Range   Lipase 23 11 - 51 U/L  Comprehensive metabolic panel  Result Value Ref Range   Sodium 140 135 - 145 mmol/L   Potassium 3.3 (L) 3.5 - 5.1 mmol/L   Chloride 105 98 - 111 mmol/L   CO2 24 22 - 32 mmol/L   Glucose, Bld 104 (H) 70 - 99 mg/dL   BUN <5 (L) 6 - 20 mg/dL   Creatinine, Ser 5.46 0.44 - 1.00 mg/dL   Calcium 8.5 (L) 8.9 - 10.3 mg/dL   Total Protein 6.8 6.5 - 8.1 g/dL   Albumin 3.7 3.5 - 5.0 g/dL   AST 21 15 - 41 U/L   ALT 14 0 - 44 U/L   Alkaline Phosphatase 65 38 - 126 U/L   Total Bilirubin 0.3 0.3 - 1.2 mg/dL   GFR, Estimated >56 >81 mL/min   Anion gap 11 5 - 15  CBC  Result Value Ref Range   WBC 10.4 4.0 - 10.5 K/uL   RBC 3.95 3.87 - 5.11 MIL/uL   Hemoglobin 9.3 (L) 12.0 - 15.0 g/dL   HCT 27.5 (L) 17.0 - 01.7 %   MCV 77.7 (L) 80.0 - 100.0 fL   MCH 23.5 (L) 26.0 - 34.0 pg   MCHC 30.3 30.0 - 36.0 g/dL   RDW 49.4 (H) 49.6 - 75.9 %   Platelets 391 150 - 400 K/uL   nRBC 0.0 0.0 - 0.2 %  Urinalysis, Routine w reflex microscopic Urine, Clean Catch  Result Value Ref Range   Color, Urine YELLOW YELLOW   APPearance CLEAR CLEAR   Specific Gravity, Urine 1.019 1.005 - 1.030   pH 6.0 5.0 - 8.0   Glucose, UA NEGATIVE NEGATIVE mg/dL   Hgb urine dipstick NEGATIVE NEGATIVE   Bilirubin Urine  NEGATIVE NEGATIVE    Ketones, ur NEGATIVE NEGATIVE mg/dL   Protein, ur 30 (A) NEGATIVE mg/dL   Nitrite NEGATIVE NEGATIVE   Leukocytes,Ua NEGATIVE NEGATIVE   WBC, UA 0-5 0 - 5 WBC/hpf   Bacteria, UA NONE SEEN NONE SEEN   Squamous Epithelial / LPF 0-5 0 - 5   Mucus PRESENT    Hyaline Casts, UA PRESENT   I-Stat beta hCG blood, ED  Result Value Ref Range   I-stat hCG, quantitative <5.0 <5 mIU/mL   Comment 3            EKG None  Radiology US Abdomen Limited RUQ (LIVER/GB)  Result Date: 11/07/2020 CLINICAL DATA:  Right upper quadrant pain for 1 week, postprandial pain EXAM: ULTRASOUND ABDOMEN LIMITED RIGHT UPPER QUADRANT COMPARISON:  None. FINDINGS: Gallbladder: No gallstones or wall thickening visualized. No sonographic Murphy sign noted by sonographer. Common bile duct: Diameter: 9 mm Liver: No focal lesion identified. Within normal limits in parenchymal echogenicity. Portal vein is patent on color Doppler imaging with normal direction of blood flow towards the liver. Other: None. IMPRESSION: 1. No evidence of cholelithiasis or cholecystitis. 2. Mild dilation of the common bile duct measuring 9 mm, nonspecific. No evidence of choledocholithiasis. Electronically Signed   By: Sharlet Salina M.D.   On: 11/07/2020 17:32    Procedures Procedures   Medications Ordered in ED Medications  ondansetron (ZOFRAN) injection 4 mg (4 mg Intravenous Given 11/08/20 0027)  sodium chloride 0.9 % bolus 1,000 mL (1,000 mLs Intravenous New Bag/Given 11/08/20 0030)  morphine 4 MG/ML injection 4 mg (4 mg Intravenous Given 11/08/20 0027)    ED Course  I have reviewed the triage vital signs and the nursing notes.  Pertinent labs & imaging results that were available during my care of the patient were reviewed by me and considered in my medical decision making (see chart for details).    MDM Rules/Calculators/A&P                           Patient to ED with upper abdominal pain as further detailed in the HPI. No  fever.  Medications ordered to address severity of pain. On recheck, she appears more comfortable. No vomiting.   Hgb 9.3. On chart review this was 11.3 three months ago. She denies heavy periods. No dark stool. Hemoccult negative. She is hemodynamically stable for this to be followed up outpatient.   Remainder of labs essentially unremarkable. [place] abd negative. Pain is improved.   She is felt appropriate for discharge home. Will Rx Bentyl, Zofran. Refer to GI for further evaluation if symptoms persist/worsen.   Final Clinical Impression(s) / ED Diagnoses Final diagnoses:  RUQ abdominal pain    Rx / DC Orders ED Discharge Orders     None        Danne Harbor 11/14/20 0602    Palumbo, April, MD 11/14/20 2311

## 2021-02-02 ENCOUNTER — Emergency Department (HOSPITAL_COMMUNITY): Payer: Self-pay

## 2021-02-02 ENCOUNTER — Emergency Department (HOSPITAL_COMMUNITY)
Admission: EM | Admit: 2021-02-02 | Discharge: 2021-02-02 | Disposition: A | Discharge status: Home / Self Care | Payer: Self-pay | Attending: Emergency Medicine | Admitting: Emergency Medicine

## 2021-02-02 ENCOUNTER — Encounter (HOSPITAL_COMMUNITY): Payer: Self-pay

## 2021-02-02 ENCOUNTER — Other Ambulatory Visit: Payer: Self-pay

## 2021-02-02 DIAGNOSIS — R1900 Intra-abdominal and pelvic swelling, mass and lump, unspecified site: Secondary | ICD-10-CM | POA: Insufficient documentation

## 2021-02-02 DIAGNOSIS — F17210 Nicotine dependence, cigarettes, uncomplicated: Secondary | ICD-10-CM | POA: Insufficient documentation

## 2021-02-02 DIAGNOSIS — R062 Wheezing: Secondary | ICD-10-CM | POA: Insufficient documentation

## 2021-02-02 DIAGNOSIS — R0602 Shortness of breath: Secondary | ICD-10-CM | POA: Insufficient documentation

## 2021-02-02 MED ORDER — ACETAMINOPHEN 325 MG PO TABS
650.0000 mg | ORAL_TABLET | Freq: Once | ORAL | Status: AC
  Administered 2021-02-02: 650 mg via ORAL
  Filled 2021-02-02: qty 2

## 2021-02-02 MED ORDER — ONDANSETRON 8 MG PO TBDP
8.0000 mg | ORAL_TABLET | Freq: Once | ORAL | Status: AC
  Administered 2021-02-02: 8 mg via ORAL
  Filled 2021-02-02: qty 1

## 2021-02-02 MED ORDER — ALBUTEROL SULFATE HFA 108 (90 BASE) MCG/ACT IN AERS
2.0000 | INHALATION_SPRAY | RESPIRATORY_TRACT | Status: DC
  Administered 2021-02-02: 2 via RESPIRATORY_TRACT
  Filled 2021-02-02: qty 6.7

## 2021-02-02 MED ORDER — IPRATROPIUM BROMIDE HFA 17 MCG/ACT IN AERS
2.0000 | INHALATION_SPRAY | Freq: Once | RESPIRATORY_TRACT | Status: AC
  Administered 2021-02-02: 2 via RESPIRATORY_TRACT
  Filled 2021-02-02: qty 12.9

## 2021-02-02 NOTE — ED Triage Notes (Signed)
Pt c/o pain in bilateral lungs and back x several weeks. Pt states no relief with ibuprofen. Pt states she has had cough and SHOB. Pt states that she also feels a knot on the left side of her chest.

## 2021-02-02 NOTE — ED Provider Notes (Signed)
[facility]-EMERGENCY DEPT Provider Note   CSN: [id number] Arrival date & time: 02/02/21  1144     History Chief Complaint  Patient presents with   Shortness of Breath    Suzanne Morrison is a 40 y.o. female.  40 year old female who presents with this longstanding history of chest tightness that is worse with coughing.  Patient is a daily smoker.  Denies hemoptysis.  No fever or chills.  Also complains of nodules in her abdominal area.  No emesis or diarrhea associated with that.  No urinary symptoms.  Treatment use prior to arrival      Past Medical History:  Diagnosis Date   Pneumonia    Pneumothorax    Right    There are no problems to display for this patient.   Past Surgical History:  Procedure Laterality Date   CHEST TUBE INSERTION       OB History   No obstetric history on file.     Family History  Problem Relation Age of Onset   Diabetes Mother    Healthy Father     Social History   Tobacco Use   Smoking status: Every Day    Packs/day: 0.50    Types: Cigarettes   Smokeless tobacco: Never  Vaping Use   Vaping Use: Never used  Substance Use Topics   Alcohol use: Yes   Drug use: Never    Home Medications Prior to Admission medications   Medication Sig Start Date End Date Taking? Authorizing Provider  acetaminophen (TYLENOL 8 HOUR) 650 MG CR tablet Take 1 tablet (650 mg total) by mouth every 8 (eight) hours as needed for pain. 09/26/20   Mannie Stabile, PA-C  albuterol (VENTOLIN HFA) 108 (90 Base) MCG/ACT inhaler Inhale 2 puffs into the lungs every 6 (six) hours as needed for wheezing or shortness of breath. 09/26/20   Mannie Stabile, PA-C  dicyclomine (BENTYL) 20 MG tablet Take 1 tablet (20 mg total) by mouth 2 (two) times daily. 11/08/20   Elpidio Anis, PA-C  methocarbamol (ROBAXIN) 500 MG tablet Take 1 tablet (500 mg total) by mouth 2 (two) times daily. 09/26/20   Mannie Stabile, PA-C  ondansetron (ZOFRAN ODT) 4  MG disintegrating tablet Take 1 tablet (4 mg total) by mouth every 8 (eight) hours as needed for nausea or vomiting. 11/08/20   Elpidio Anis, PA-C    Allergies    Propoxyphene  Review of Systems   Review of Systems  All other systems reviewed and are negative.  Physical Exam Updated Vital Signs BP (!) 162/93   Pulse 88   Temp 98 F (36.7 C) (Oral)   Resp 20   Ht 1.854 m (6\' 1" )   Wt 115.7 kg   LMP 01/12/2021   SpO2 100%   BMI 33.64 kg/m   Physical Exam Vitals and nursing note reviewed.  Constitutional:      General: She is not in acute distress.    Appearance: Normal appearance. She is well-developed. She is not toxic-appearing.  HENT:     Head: Normocephalic and atraumatic.  Eyes:     General: Lids are normal.     Conjunctiva/sclera: Conjunctivae normal.     Pupils: Pupils are equal, round, and reactive to light.  Neck:     Thyroid: No thyroid mass.     Trachea: No tracheal deviation.  Cardiovascular:     Rate and Rhythm: Normal rate and regular rhythm.     Heart sounds: Normal heart sounds.  No murmur heard.   No gallop.  Pulmonary:     Effort: Pulmonary effort is normal. No respiratory distress.     Breath sounds: No stridor. Decreased breath sounds and wheezing present. No rhonchi or rales.  Abdominal:     General: There is no distension.     Palpations: Abdomen is soft.     Tenderness: There is no abdominal tenderness. There is no rebound.     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 Normal range of motion.     Cervical back: Normal range of motion and neck supple.  Skin:    General: Skin is warm and dry.     Findings: No abrasion or rash.  Neurological:     Mental Status: She is alert and oriented to person, place, and time. Mental status is at baseline.     GCS: GCS eye subscore is 4. GCS verbal subscore is 5. GCS motor subscore is 6.     Cranial Nerves: Cranial nerves are  intact. No cranial nerve deficit.     Sensory: No sensory deficit.     Motor: Motor function is intact.  Psychiatric:        Attention and Perception: Attention normal.        Speech: Speech normal.        Behavior: Behavior normal.    ED Results / Procedures / Treatments   Labs (all labs ordered are listed, but only abnormal results are displayed) Labs Reviewed - No data to display  EKG EKG Interpretation  Date/Time:  Sunday February 02 2021 12:17:59 EDT Ventricular Rate:  88 PR Interval:  140 QRS Duration: 85 QT Interval:  391 QTC Calculation: 474 R Axis:   22 Text Interpretation: Sinus rhythm RSR' in V1 or V2, probably normal variant Confirmed by Lorre Nick ([id number]) on 02/02/2021 1:13:07 PM  Radiology No results found.  Procedures Procedures   Medications Ordered in ED Medications  albuterol (VENTOLIN HFA) 108 (90 Base) MCG/ACT inhaler 2 puff (has no administration in time range)  ipratropium (ATROVENT HFA) inhaler 2 puff (has no administration in time range)    ED Course  I have reviewed the triage vital signs and the nursing notes.  Pertinent labs & imaging results that were available during my care of the patient were reviewed by me and considered in my medical decision making (see chart for details).    MDM Rules/Calculators/A&P                           Patient given albuterol and Atrovent for wheezing.  Chest x-ray without acute findings.  Went back to discuss patient's results and she has eloped Final Clinical Impression(s) / ED Diagnoses Final diagnoses:  SOB (shortness of breath)    Rx / DC Orders ED Discharge Orders     None        Lorre Nick, MD 02/02/21 1510

## 2021-05-07 ENCOUNTER — Other Ambulatory Visit: Payer: Self-pay

## 2021-05-07 ENCOUNTER — Emergency Department (HOSPITAL_COMMUNITY): Payer: Self-pay

## 2021-05-07 ENCOUNTER — Emergency Department (HOSPITAL_COMMUNITY)
Admission: EM | Admit: 2021-05-07 | Discharge: 2021-05-07 | Disposition: A | Discharge status: Home / Self Care | Payer: Self-pay | Attending: Emergency Medicine | Admitting: Emergency Medicine

## 2021-05-07 ENCOUNTER — Encounter (HOSPITAL_COMMUNITY): Payer: Self-pay

## 2021-05-07 DIAGNOSIS — R000 Tachycardia, unspecified: Secondary | ICD-10-CM | POA: Insufficient documentation

## 2021-05-07 DIAGNOSIS — R0602 Shortness of breath: Secondary | ICD-10-CM | POA: Insufficient documentation

## 2021-05-07 DIAGNOSIS — Z5321 Procedure and treatment not carried out due to patient leaving prior to being seen by health care provider: Secondary | ICD-10-CM | POA: Insufficient documentation

## 2021-05-07 LAB — URINALYSIS, ROUTINE W REFLEX MICROSCOPIC
Bilirubin Urine: NEGATIVE
Glucose, UA: NEGATIVE mg/dL
Hgb urine dipstick: NEGATIVE
Ketones, ur: NEGATIVE mg/dL
Leukocytes,Ua: NEGATIVE
Nitrite: NEGATIVE
Protein, ur: NEGATIVE mg/dL
Specific Gravity, Urine: 1.014 (ref 1.005–1.030)
pH: 7 (ref 5.0–8.0)

## 2021-05-07 LAB — CBC WITH DIFFERENTIAL/PLATELET
Abs Immature Granulocytes: 0.04 10*3/uL (ref 0.00–0.07)
Basophils Absolute: 0.1 10*3/uL (ref 0.0–0.1)
Basophils Relative: 1 %
Eosinophils Absolute: 0.4 10*3/uL (ref 0.0–0.5)
Eosinophils Relative: 3 %
HCT: 33.8 % — ABNORMAL LOW (ref 36.0–46.0)
Hemoglobin: 10 g/dL — ABNORMAL LOW (ref 12.0–15.0)
Immature Granulocytes: 0 %
Lymphocytes Relative: 24 %
Lymphs Abs: 2.9 10*3/uL (ref 0.7–4.0)
MCH: 22.7 pg — ABNORMAL LOW (ref 26.0–34.0)
MCHC: 29.6 g/dL — ABNORMAL LOW (ref 30.0–36.0)
MCV: 76.8 fL — ABNORMAL LOW (ref 80.0–100.0)
Monocytes Absolute: 1.5 10*3/uL — ABNORMAL HIGH (ref 0.1–1.0)
Monocytes Relative: 12 %
Neutro Abs: 7.1 10*3/uL (ref 1.7–7.7)
Neutrophils Relative %: 60 %
Platelets: 366 10*3/uL (ref 150–400)
RBC: 4.4 MIL/uL (ref 3.87–5.11)
RDW: 17.7 % — ABNORMAL HIGH (ref 11.5–15.5)
WBC: 12.1 10*3/uL — ABNORMAL HIGH (ref 4.0–10.5)
nRBC: 0 % (ref 0.0–0.2)

## 2021-05-07 LAB — COMPREHENSIVE METABOLIC PANEL
ALT: 13 U/L (ref 0–44)
AST: 16 U/L (ref 15–41)
Albumin: 3.7 g/dL (ref 3.5–5.0)
Alkaline Phosphatase: 72 U/L (ref 38–126)
Anion gap: 7 (ref 5–15)
BUN: 9 mg/dL (ref 6–20)
CO2: 29 mmol/L (ref 22–32)
Calcium: 8.7 mg/dL — ABNORMAL LOW (ref 8.9–10.3)
Chloride: 98 mmol/L (ref 98–111)
Creatinine, Ser: 0.86 mg/dL (ref 0.44–1.00)
GFR, Estimated: >60 mL/min (ref >60)
Glucose, Bld: 104 mg/dL — ABNORMAL HIGH (ref 70–99)
Potassium: 3.2 mmol/L — ABNORMAL LOW (ref 3.5–5.1)
Sodium: 134 mmol/L — ABNORMAL LOW (ref 135–145)
Total Bilirubin: 0.3 mg/dL (ref 0.3–1.2)
Total Protein: 7.3 g/dL (ref 6.5–8.1)

## 2021-05-07 LAB — I-STAT BETA HCG BLOOD, ED (MC, WL, AP ONLY): I-stat hCG, quantitative: <5 m[IU]/mL (ref <5)

## 2021-05-07 NOTE — ED Triage Notes (Signed)
Pt reports with tachycardia and shortness of breath since yesterday. Pt states that she had a collapsed right lung before.

## 2021-05-24 ENCOUNTER — Encounter (HOSPITAL_COMMUNITY): Payer: Self-pay | Admitting: Emergency Medicine

## 2021-05-24 ENCOUNTER — Emergency Department (HOSPITAL_COMMUNITY): Payer: Self-pay

## 2021-05-24 ENCOUNTER — Other Ambulatory Visit: Payer: Self-pay

## 2021-05-24 ENCOUNTER — Emergency Department (HOSPITAL_COMMUNITY)
Admission: EM | Admit: 2021-05-24 | Discharge: 2021-05-24 | Discharge status: Against Medical Advice | Payer: Self-pay | Attending: Emergency Medicine | Admitting: Emergency Medicine

## 2021-05-24 DIAGNOSIS — T405X1A Poisoning by cocaine, accidental (unintentional), initial encounter: Secondary | ICD-10-CM | POA: Insufficient documentation

## 2021-05-24 DIAGNOSIS — R062 Wheezing: Secondary | ICD-10-CM | POA: Insufficient documentation

## 2021-05-24 DIAGNOSIS — R059 Cough, unspecified: Secondary | ICD-10-CM | POA: Insufficient documentation

## 2021-05-24 DIAGNOSIS — T50901A Poisoning by unspecified drugs, medicaments and biological substances, accidental (unintentional), initial encounter: Secondary | ICD-10-CM

## 2021-05-24 DIAGNOSIS — R0789 Other chest pain: Secondary | ICD-10-CM | POA: Insufficient documentation

## 2021-05-24 LAB — CBC WITH DIFFERENTIAL/PLATELET
Abs Immature Granulocytes: 0.17 10*3/uL — ABNORMAL HIGH (ref 0.00–0.07)
Basophils Absolute: 0.1 10*3/uL (ref 0.0–0.1)
Basophils Relative: 0 %
Eosinophils Absolute: 0 10*3/uL (ref 0.0–0.5)
Eosinophils Relative: 0 %
HCT: 34.5 % — ABNORMAL LOW (ref 36.0–46.0)
Hemoglobin: 10.2 g/dL — ABNORMAL LOW (ref 12.0–15.0)
Immature Granulocytes: 1 %
Lymphocytes Relative: 3 %
Lymphs Abs: 0.7 10*3/uL (ref 0.7–4.0)
MCH: 22.9 pg — ABNORMAL LOW (ref 26.0–34.0)
MCHC: 29.6 g/dL — ABNORMAL LOW (ref 30.0–36.0)
MCV: 77.5 fL — ABNORMAL LOW (ref 80.0–100.0)
Monocytes Absolute: 2.2 10*3/uL — ABNORMAL HIGH (ref 0.1–1.0)
Monocytes Relative: 9 %
Neutro Abs: 21.3 10*3/uL — ABNORMAL HIGH (ref 1.7–7.7)
Neutrophils Relative %: 87 %
Platelets: 413 10*3/uL — ABNORMAL HIGH (ref 150–400)
RBC: 4.45 MIL/uL (ref 3.87–5.11)
RDW: 18.4 % — ABNORMAL HIGH (ref 11.5–15.5)
WBC: 24.3 10*3/uL — ABNORMAL HIGH (ref 4.0–10.5)
nRBC: 0 % (ref 0.0–0.2)

## 2021-05-24 LAB — BASIC METABOLIC PANEL
Anion gap: 7 (ref 5–15)
BUN: 8 mg/dL (ref 6–20)
CO2: 28 mmol/L (ref 22–32)
Calcium: 8.7 mg/dL — ABNORMAL LOW (ref 8.9–10.3)
Chloride: 101 mmol/L (ref 98–111)
Creatinine, Ser: 0.75 mg/dL (ref 0.44–1.00)
GFR, Estimated: >60 mL/min (ref >60)
Glucose, Bld: 107 mg/dL — ABNORMAL HIGH (ref 70–99)
Potassium: 3.1 mmol/L — ABNORMAL LOW (ref 3.5–5.1)
Sodium: 136 mmol/L (ref 135–145)

## 2021-05-24 LAB — I-STAT BETA HCG BLOOD, ED (MC, WL, AP ONLY): I-stat hCG, quantitative: <5 m[IU]/mL (ref <5)

## 2021-05-24 MED ORDER — ALBUTEROL SULFATE HFA 108 (90 BASE) MCG/ACT IN AERS
2.0000 | INHALATION_SPRAY | Freq: Once | RESPIRATORY_TRACT | Status: AC
  Administered 2021-05-24: 2 via RESPIRATORY_TRACT
  Filled 2021-05-24: qty 6.7

## 2021-05-24 MED ORDER — LEVOFLOXACIN 500 MG PO TABS: 500.0000 mg | ORAL_TABLET | Freq: Every day | ORAL | 0 refills | Status: AC

## 2021-05-24 MED ORDER — LEVOFLOXACIN 500 MG PO TABS
500.0000 mg | ORAL_TABLET | Freq: Once | ORAL | Status: AC
  Administered 2021-05-24: 500 mg via ORAL
  Filled 2021-05-24: qty 1

## 2021-05-24 MED ORDER — ONDANSETRON HCL 4 MG/2ML IJ SOLN
4.0000 mg | Freq: Once | INTRAMUSCULAR | Status: AC
  Administered 2021-05-24: 4 mg via INTRAVENOUS
  Filled 2021-05-24: qty 2

## 2021-05-24 NOTE — Discharge Instructions (Addendum)
Return for any problems.  Take Levaquin as prescribed.  Finish courses of previously prescribed antibiotics and steroids.  Do not use illicit drugs.   Substance Abuse Treatment Programs  Intensive Outpatient Programs [facility]     [address]      [place], [place]                   [phone]       [facility] [address] [address] [place], [place] [phone]  [facility]     (Inpatient and outpatient)     [address]           [phone]    [facility] [phone] (Suboxone and Methadone)  [address]      [place], [place] [address]      [phone]       [address] [place], [place] [phone]  [facility] (Outpatient/Inpatient, [facility])    (insurance only) [phone]             [facility] (Groups & Residential) [place], [place] [phone]     [facility]     [address]     [place], [place]      [phone]       [facility] (for caregivers and family) [phone]urell [address] [place], [place] [phone]      Residential Treatment Programs [facility]      [address] [place], [place] [address]  [phone]       [facility] [address] [place], [place] [address] [phone]  [facility]        [phone]       [facility] [phone]  [facility]             [address]                                         [place], [place]                                                [phone] or [phone]                               [facility] [address] [place], [address] [phone]  [facility]    [address]      [place], [place]     [phone]       [facility] [address] [place], [place] [phone]  [facility]   [address]  [place], [place] [address]     [phone]      Admissions: 8am-3pm M-F  [facility] ([facility]) [address] [place], [place] [phone]  [facility]: Residential Program [phone] Days)   [place], [place]      [phone] or [phone]     [facility]: [facility] [place], [place] (Walk in Hours over the weekend or by referral)  [facility] [address] [place], [place], [place] [address] [phone]  Crisis Mobile: [facility]:  [phone] (for crisis response 24 hours a day) [place]:      [phone] Outpatient Psychiatry and Counseling  [facility]: Mobile Crisis Management 24 hours:  [phone]  [facility] [employer] sliding scale fee and walk in schedule: M-F 8am-12pm/1pm-3pm [place], [place] [address] [place], [place] [address] [phone]  [facility] ([facility])- new patient walk-in appointments available Monday - Friday 8am -3pm.          [address] [place], [place] [address] [phone] or crisis line- [facility]/ Intensive Outpatient Therapy Program [place], [place] [address] [place]      [phone] [address] [place], [place] [address]                 [place]   [facility] [phone]. [place], [place] [address]   [employer] [facility]          [address] Johnette Abraham  [place], [place] [address]       [phone]  [facility] [address] [place], [place] [address] [phone]  [facility]    [address] [place], [place]  [address]     [place], [place] Joycelyn Man     [place] [address]     [phone]       [facility] [place] [address]  [facility]     [address], [place], [place], [place] [address] [place]     [address]     [place], [place] [address]     [phone]       [facility] [address] [place], [place] [address] [phone] Resources for Temporary Residential Assistance/Crisis [facility]) M-F 8am-3pm   [address] [place], [place] [address]   [phone] Services include: laundry, barbering, support groups, case management, phone  & computer access, showers, AA/NA mtgs, mental health/substance abuse nurse, job skills class, disability information, VA assistance, spiritual classes, etc.   [facility]   [address], [place]     [place]              [employer] (women and children)       [place]. [place], [place] [address] [phone] [email or web address] for application and process Application Required  [facility]   [address]    [place] [address]     [phone]                    [place],  [id number] application appt.) Application Required  [facility] (  women only)    [place], [place] [address]     [phone]      Intake starts 6pm daily Need valid ID, SSC, & Police report [employer] [address] [place], [place] [phone] Application Required  [employer] (men only)     [place].       [place], [place]       [place] (Pregnant women only) [address] [place], [place]  [facility]      [place] [address].      [place], [place] [address]     [phone]             [facility] [address] [place], [place] 90 day commitment/SA/Application process  [facility](men only)     [address]     [place], [place]       Check-in at [facility] [address] [place], [place] [address] [phone] Men/Women/Women and Children must be there by 7 pm  [place], [place]

## 2021-05-24 NOTE — ED Triage Notes (Signed)
Pt BIB [facility] called out for overdose x 2, first call was about 1.5hr ago, pt reports she bought cocaine, pt was given 1mg  IN Narcan but refused transport; EMS called out a second time and pt requested transport, upon EMS arrival pt is a&o x 4 and ambulatory, no Narcan administered

## 2021-05-24 NOTE — ED Provider Notes (Signed)
Patient seen after prior EDP  Upon my evaluation the patient is comfortable.  She is speaking in full sentences.  Room air saturations are 98%.  Patient is adamant that she does not want to stay in the hospital.  Patient reports that she is finishing a course of a Z-Pak as prescribed by another provider for treatment of possible upper respiratory infection.  She is currently still taking prednisone as also previously prescribed.  She denies any difficulty breathing at this time.  She denies any significant cough.  She admits that using cocaine this morning was a "bad idea".  She reports that she had a negative COVID test performed 2 days prior.  She declines repeat COVID testing.  CT of the chest is concerning for possible developing multifocal pneumonia.  In the setting of recent cocaine use - she apparently snorted this drug - differential includes possible acute lung injury related to drug use.  Patient's white count is elevated.  This is possibly secondary to overdose versus recent steroid use versus infection.  Given patient's desire to sign out AMA, will provide additional course of Levaquin for treatment of possible aspiration.  Patient is strongly advised to follow-up closely with the regular care provider.  She is strongly advised to return to the ED if her symptoms worsen or fail to improve.  She is strongly advised to avoid use of illicit drugs in the future.     Wynetta Fines, MD 05/26/21 [phone]

## 2021-05-24 NOTE — ED Notes (Signed)
Pt ambulatory to waiting room. Pt verbalized understanding of discharge instructions.   
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
## 2021-05-24 NOTE — ED Notes (Signed)
Pt unable to sign for MSE 

## 2021-08-17 ENCOUNTER — Other Ambulatory Visit: Payer: Self-pay

## 2021-08-17 ENCOUNTER — Encounter (HOSPITAL_COMMUNITY): Payer: Self-pay | Admitting: Oncology

## 2021-08-17 ENCOUNTER — Emergency Department (HOSPITAL_COMMUNITY): Payer: Self-pay

## 2021-08-17 ENCOUNTER — Emergency Department (HOSPITAL_COMMUNITY)
Admission: EM | Admit: 2021-08-17 | Discharge: 2021-08-17 | Disposition: A | Discharge status: Home / Self Care | Payer: Self-pay | Attending: Emergency Medicine | Admitting: Emergency Medicine

## 2021-08-17 DIAGNOSIS — R0602 Shortness of breath: Secondary | ICD-10-CM | POA: Insufficient documentation

## 2021-08-17 DIAGNOSIS — Z5321 Procedure and treatment not carried out due to patient leaving prior to being seen by health care provider: Secondary | ICD-10-CM | POA: Insufficient documentation

## 2021-08-17 DIAGNOSIS — R079 Chest pain, unspecified: Secondary | ICD-10-CM | POA: Insufficient documentation

## 2021-08-17 MED ORDER — ALBUTEROL SULFATE HFA 108 (90 BASE) MCG/ACT IN AERS: 2.0000 | INHALATION_SPRAY | RESPIRATORY_TRACT | Status: DC | PRN

## 2021-08-17 NOTE — ED Triage Notes (Signed)
Pt c/o 1 week of shob and CP. Pt has, "Lumps" on her abdomen that have been present for, "Months." ?

## 2022-06-09 IMAGING — DX DG CHEST 1V PORT
1 series · 1 of 1 positions shown · non-contrast
Comparison: Chest radiograph dated 02/02/2021.

CLINICAL DATA: Shortness of breath.

EXAM:
PORTABLE CHEST 1 VIEW

[chest ap]
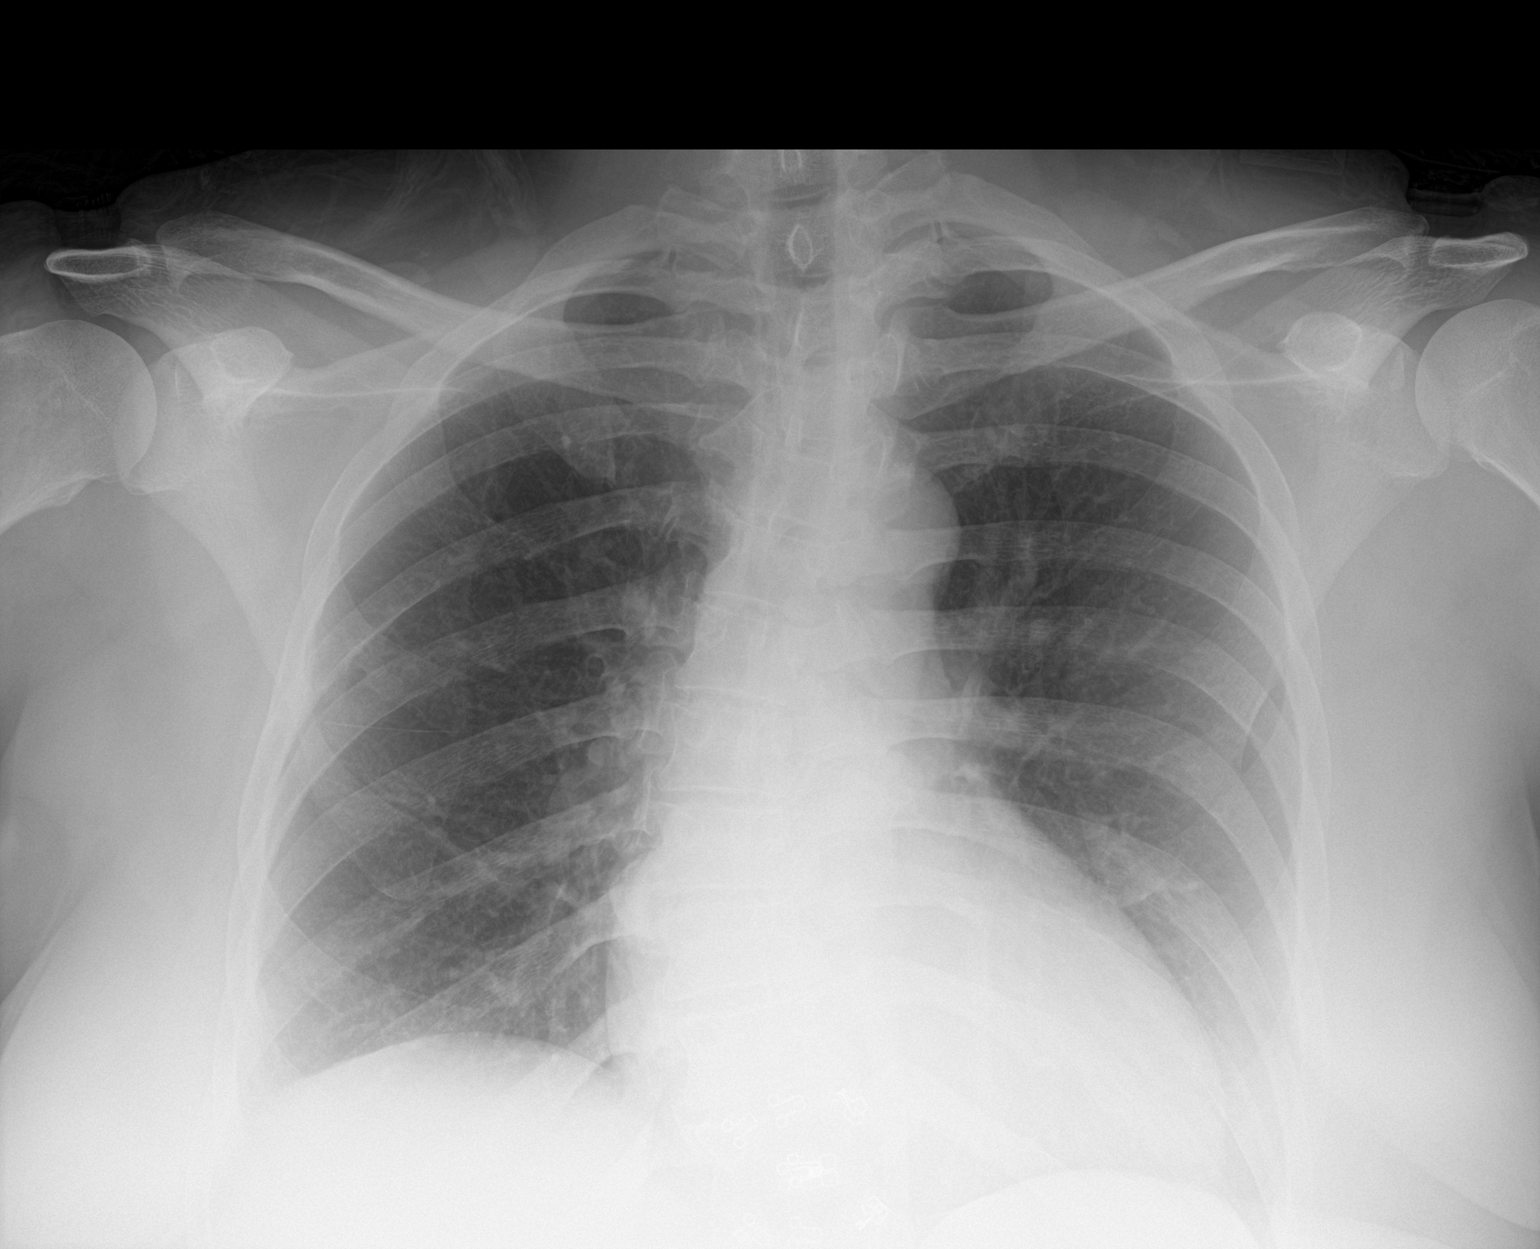

[1 of 1 positions shown; findings below may reference images not displayed]

FINDINGS: No focal consolidation, pleural effusion, or pneumothorax.
Top-normal cardiac size. No acute osseous pathology.
IMPRESSION: No active disease.

## 2022-06-26 IMAGING — CT CT CHEST W/O CM
2 of 3 series · 15 of 36 positions shown, 18 images · non-contrast
Comparison: No comparison studies available.

CLINICAL DATA: Drug overdose.  Cough and wheezing.



[Series 2: thorax · axial · 0.73mm/px · z∈[+1271,+1525]mm · 12 of 149 slices shown, 15 images]
[im 11/149  mediastinal]
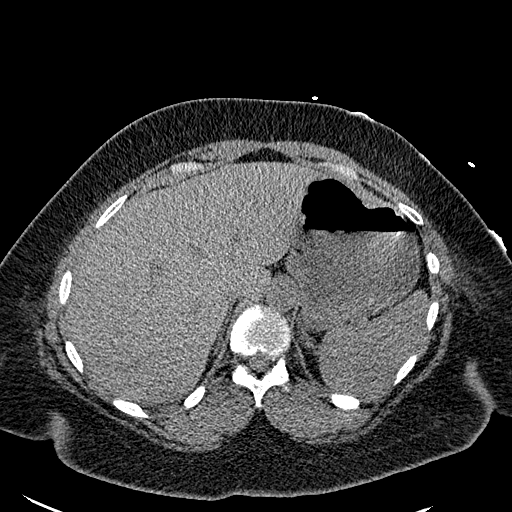
[im 11/149  lung]
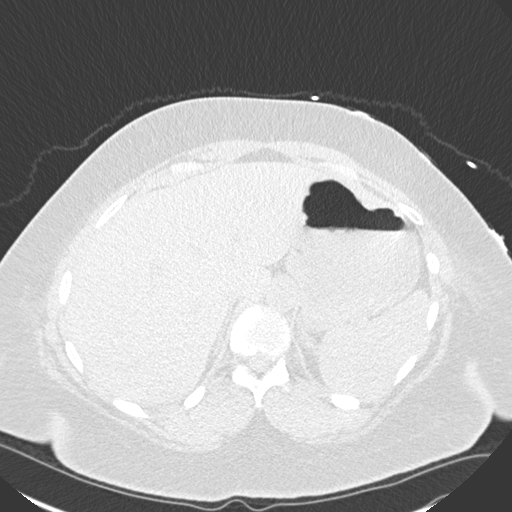
[im 22/149  lung]
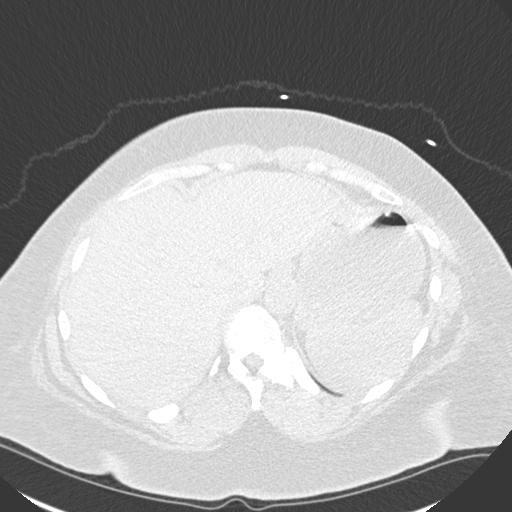
[im 33/149  lung]
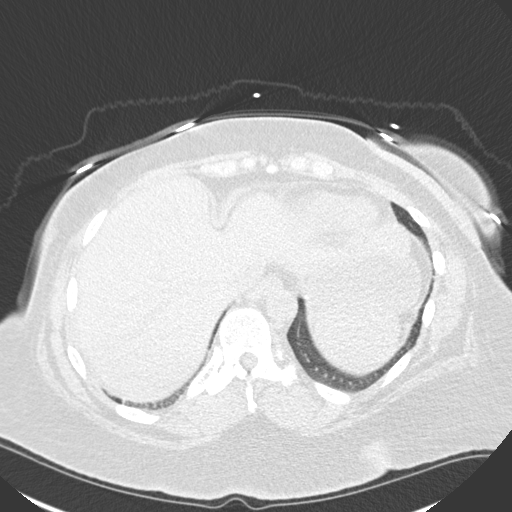
[im 44/149  lung]
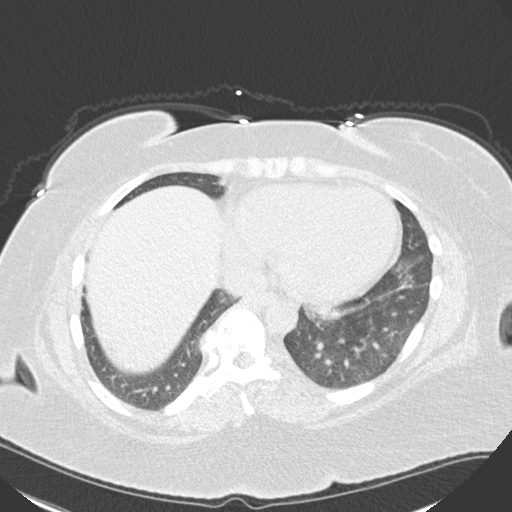
[im 55/149  mediastinal]
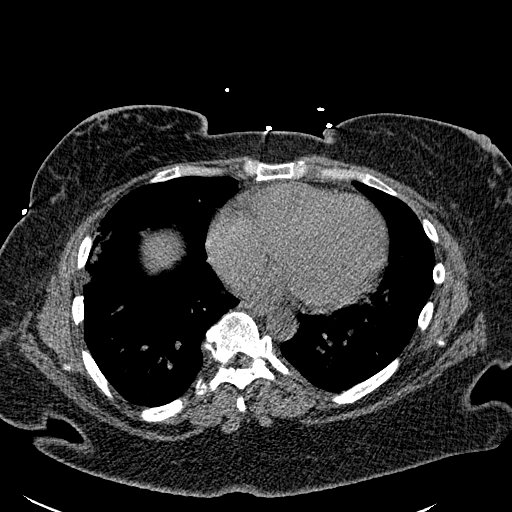
[im 55/149  lung]
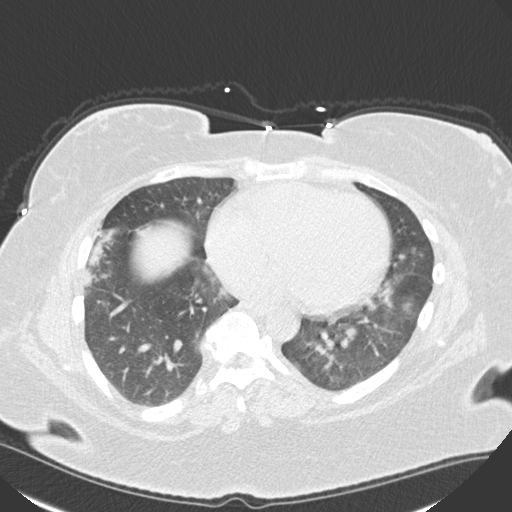
[im 66/149  lung]
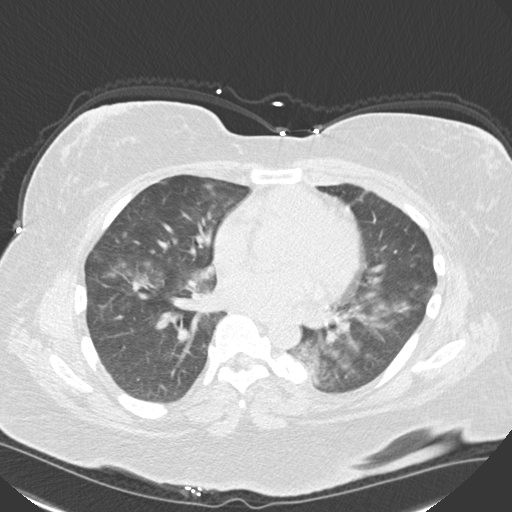
[im 83/149  lung]
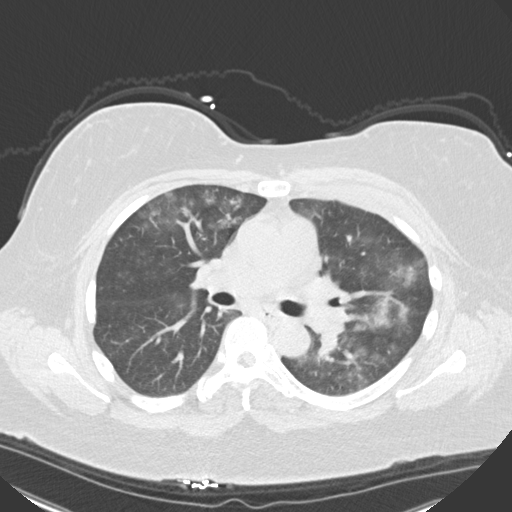
[im 94/149  lung]
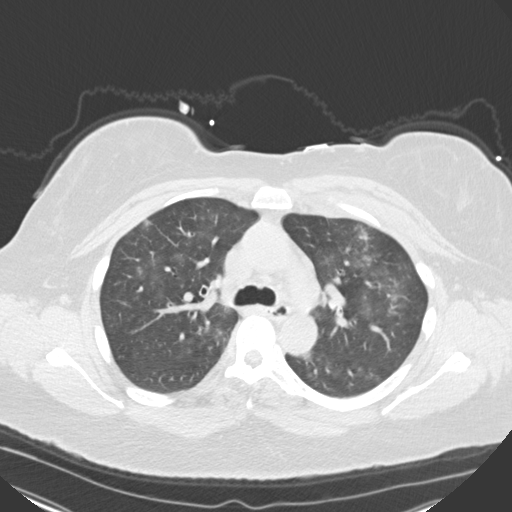
[im 105/149  mediastinal]
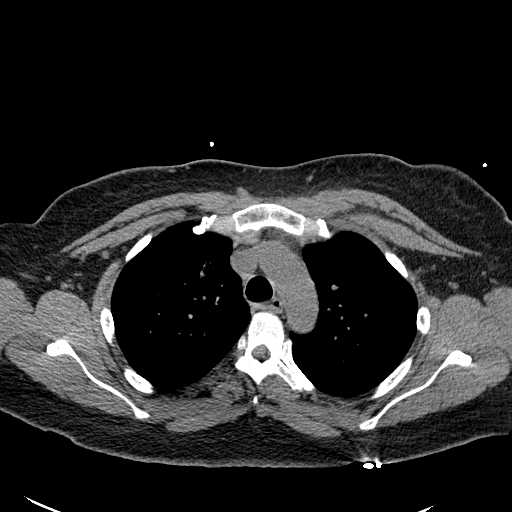
[im 105/149  lung]
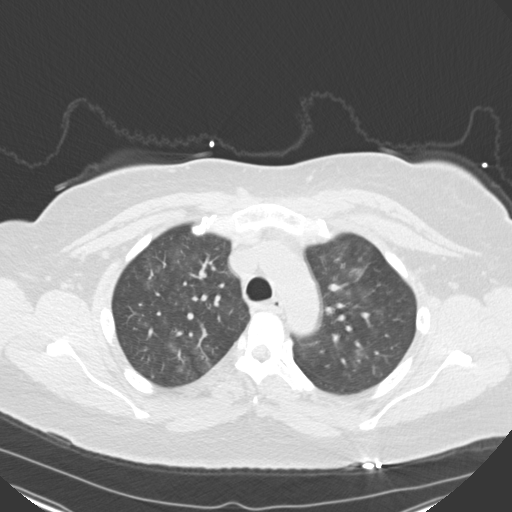
[im 116/149  lung]
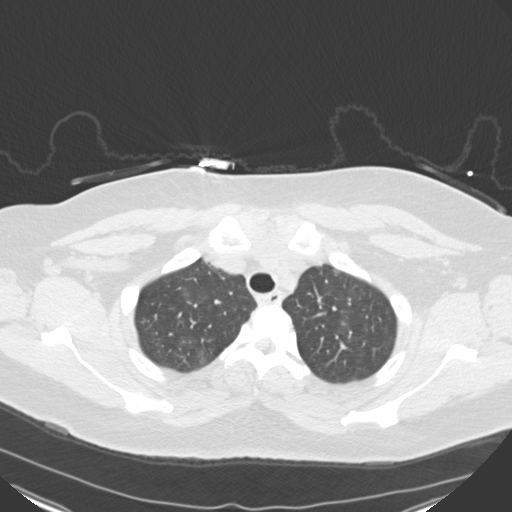
[im 127/149  lung]
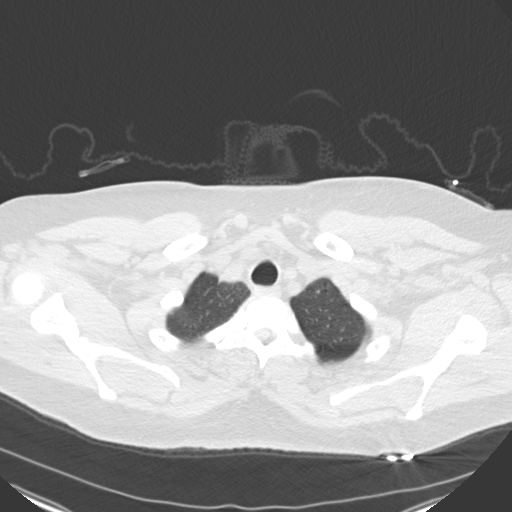
[im 138/149  lung]
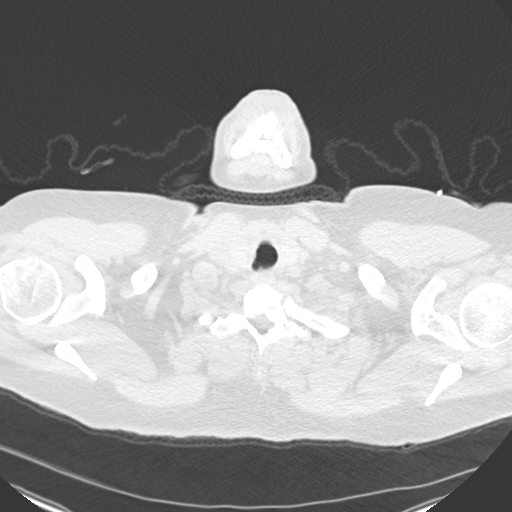

[Series 6: coronal · coronal · 0.59mm/px · 3 of 136 slices shown]
[im 28/136  lung]
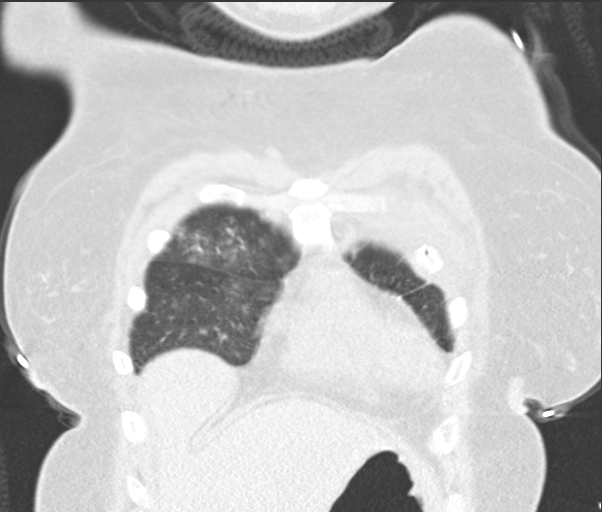
[im 55/136  lung]
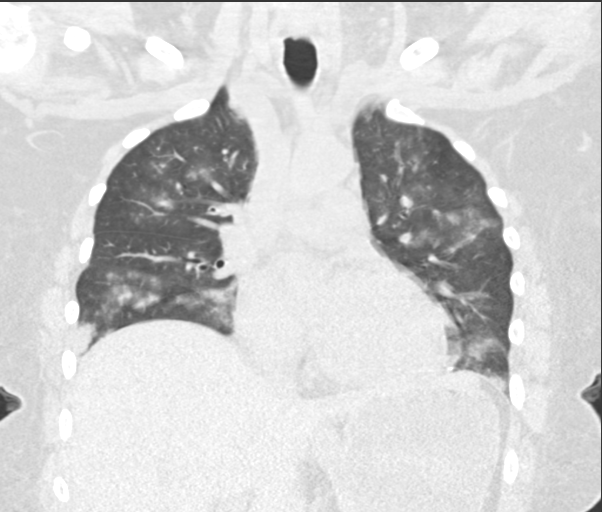
[im 82/136  lung]
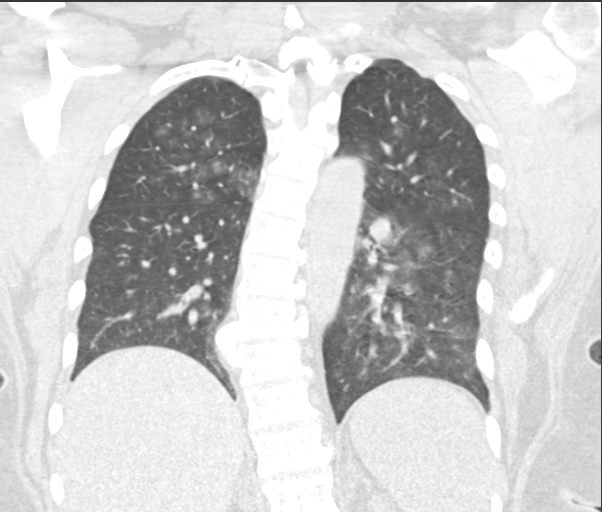

[15 of 36 positions shown; findings below may reference images not displayed]

FINDINGS: Cardiovascular: The heart size is normal.

Mediastinum/Nodes: No mediastinal lymphadenopathy. No evidence for
gross hilar lymphadenopathy although assessment is limited by the
lack of intravenous contrast on the current study. The esophagus has
normal imaging features. There is no axillary lymphadenopathy.

Lungs/Pleura: Patchy and nodular areas of ill-defined ground-glass
and confluent airspace disease are seen in both lungs, left greater
than right. Airspace opacity is a central lung predominant. No
pneumothorax or pleural effusion.

Upper Abdomen: Unremarkable.

Musculoskeletal: No worrisome lytic or sclerotic osseous
abnormality.
IMPRESSION: Patchy and nodular areas of ill-defined ground-glass and confluent
airspace disease in both lungs, left greater than right. Imaging
features are most suggestive of multifocal pneumonia. Atypical
etiology would be a consideration although COVID 19 pneumonia is
more classically a peripheral airspace pattern. Asymmetric pulmonary
edema a consideration but considered less likely. In lesional injury
not excluded.

## 2022-09-19 IMAGING — CR DG CHEST 2V
2 series · 2 of 2 positions shown · non-contrast
Comparison: 05/24/2021

CLINICAL DATA: Shortness of breath

EXAM:
CHEST - 2 VIEW

[w chest pa]
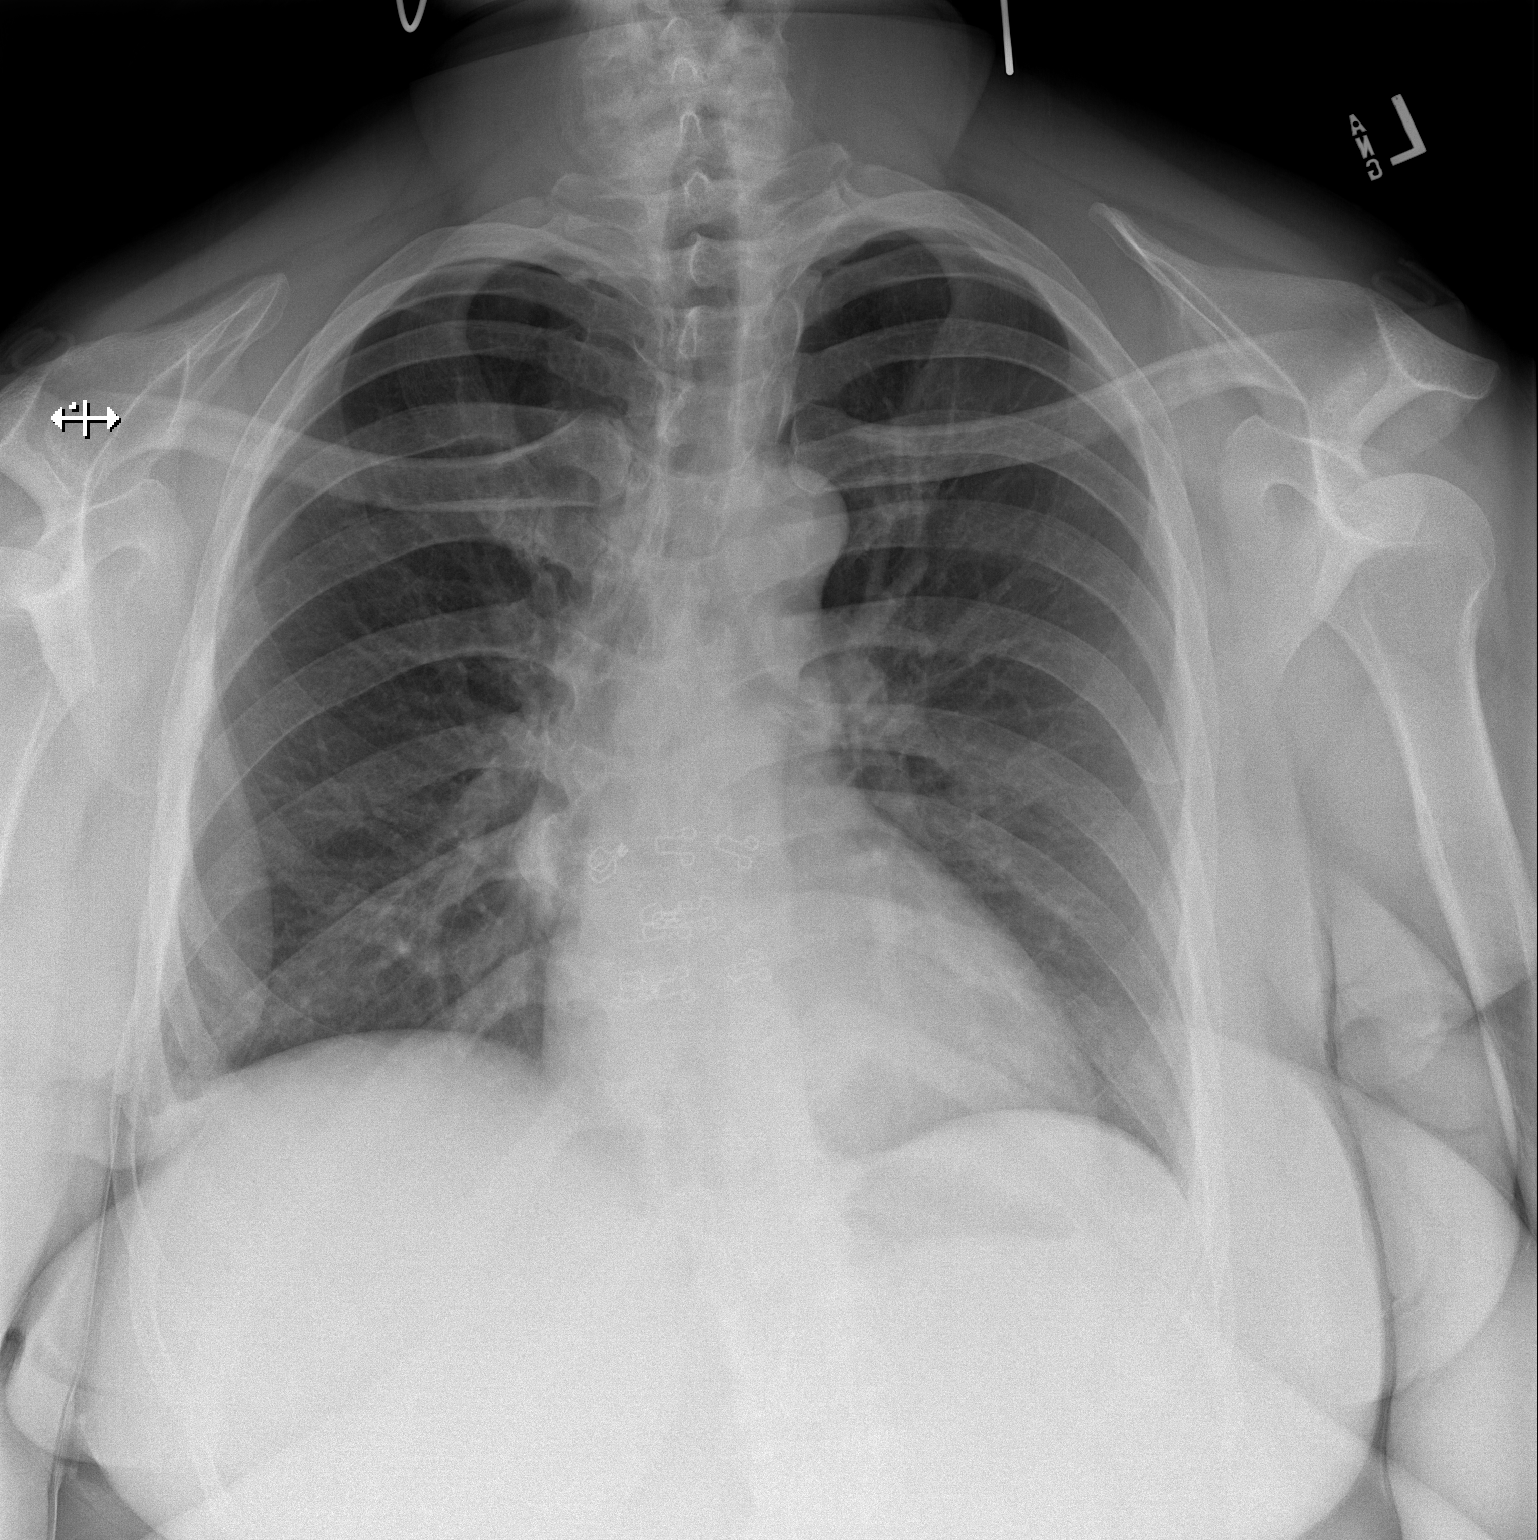

[w chest lat]
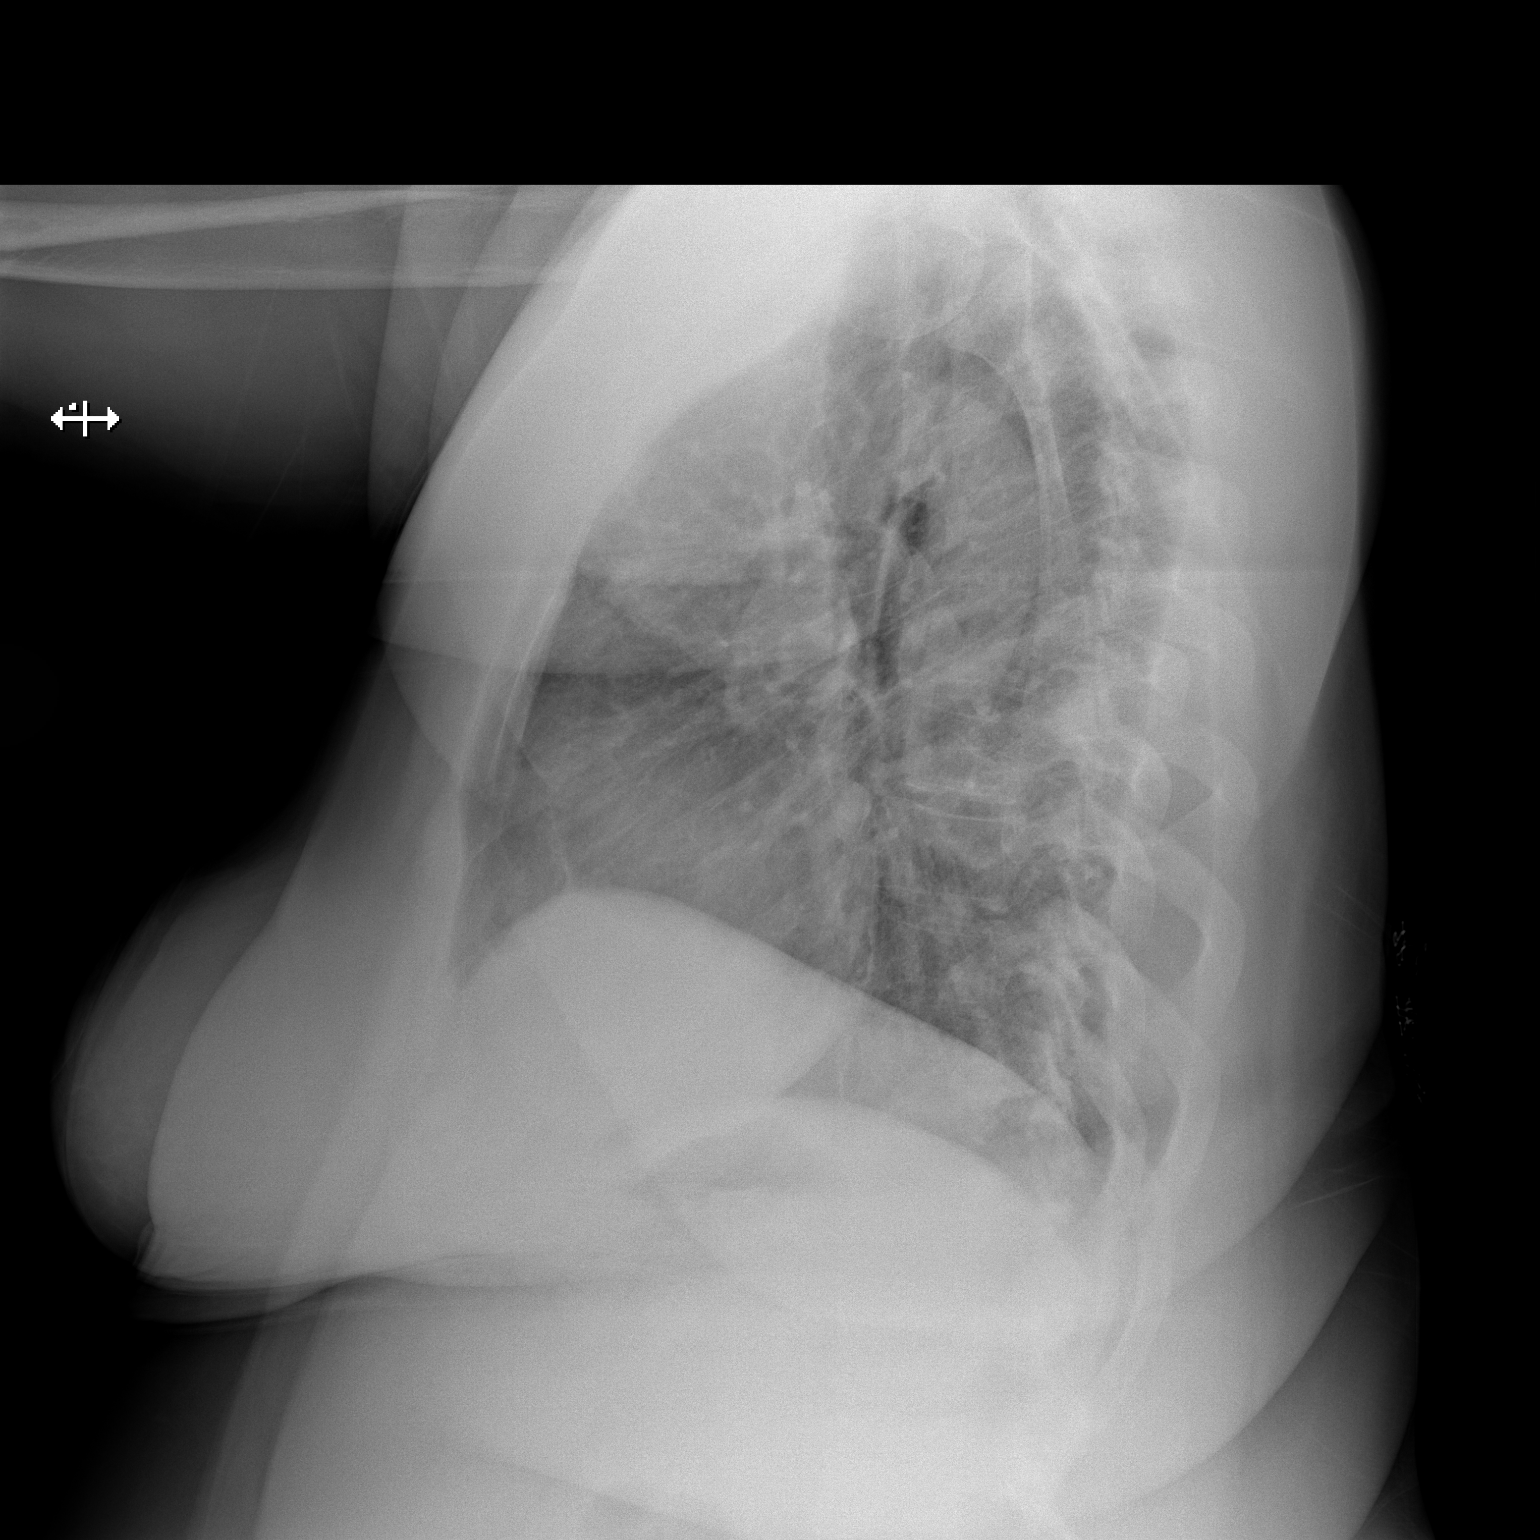

[2 of 2 positions shown; findings below may reference images not displayed]

FINDINGS: The heart size and mediastinal contours are within normal limits. No
focal airspace consolidation, pleural effusion, or pneumothorax.
Mild dextroscoliotic thoracic curvature.
IMPRESSION: No active cardiopulmonary disease.

## 2022-09-30 ENCOUNTER — Emergency Department (HOSPITAL_COMMUNITY): Payer: Self-pay

## 2022-09-30 ENCOUNTER — Encounter (HOSPITAL_COMMUNITY): Payer: Self-pay

## 2022-09-30 ENCOUNTER — Emergency Department (HOSPITAL_COMMUNITY)
Admission: EM | Admit: 2022-09-30 | Discharge: 2022-09-30 | Disposition: A | Discharge status: Home / Self Care | Payer: Self-pay | Attending: Emergency Medicine | Admitting: Emergency Medicine

## 2022-09-30 DIAGNOSIS — D3501 Benign neoplasm of right adrenal gland: Secondary | ICD-10-CM | POA: Insufficient documentation

## 2022-09-30 DIAGNOSIS — J3089 Other allergic rhinitis: Secondary | ICD-10-CM | POA: Insufficient documentation

## 2022-09-30 DIAGNOSIS — K029 Dental caries, unspecified: Secondary | ICD-10-CM | POA: Insufficient documentation

## 2022-09-30 DIAGNOSIS — M545 Low back pain, unspecified: Secondary | ICD-10-CM | POA: Insufficient documentation

## 2022-09-30 DIAGNOSIS — G8929 Other chronic pain: Secondary | ICD-10-CM | POA: Insufficient documentation

## 2022-09-30 LAB — URINALYSIS, ROUTINE W REFLEX MICROSCOPIC
Bilirubin Urine: NEGATIVE
Glucose, UA: NEGATIVE mg/dL
Hgb urine dipstick: NEGATIVE
Ketones, ur: NEGATIVE mg/dL
Leukocytes,Ua: NEGATIVE
Nitrite: NEGATIVE
Protein, ur: NEGATIVE mg/dL
Specific Gravity, Urine: 1.021 (ref 1.005–1.030)
pH: 6 (ref 5.0–8.0)

## 2022-09-30 LAB — CBC
HCT: 35.2 % — ABNORMAL LOW (ref 36.0–46.0)
Hemoglobin: 10.8 g/dL — ABNORMAL LOW (ref 12.0–15.0)
MCH: 25.6 pg — ABNORMAL LOW (ref 26.0–34.0)
MCHC: 30.7 g/dL (ref 30.0–36.0)
MCV: 83.4 fL (ref 80.0–100.0)
Platelets: 301 10*3/uL (ref 150–400)
RBC: 4.22 MIL/uL (ref 3.87–5.11)
RDW: 16.2 % — ABNORMAL HIGH (ref 11.5–15.5)
WBC: 7.7 10*3/uL (ref 4.0–10.5)
nRBC: 0 % (ref 0.0–0.2)

## 2022-09-30 LAB — COMPREHENSIVE METABOLIC PANEL WITH GFR
ALT: 14 U/L (ref 0–44)
AST: 18 U/L (ref 15–41)
Albumin: 3.6 g/dL (ref 3.5–5.0)
Alkaline Phosphatase: 56 U/L (ref 38–126)
Anion gap: 8 (ref 5–15)
BUN: 9 mg/dL (ref 6–20)
CO2: 29 mmol/L (ref 22–32)
Calcium: 8.7 mg/dL — ABNORMAL LOW (ref 8.9–10.3)
Chloride: 99 mmol/L (ref 98–111)
Creatinine, Ser: 0.87 mg/dL (ref 0.44–1.00)
GFR, Estimated: >60 mL/min
Glucose, Bld: 95 mg/dL (ref 70–99)
Potassium: 3.3 mmol/L — ABNORMAL LOW (ref 3.5–5.1)
Sodium: 136 mmol/L (ref 135–145)
Total Bilirubin: 0.4 mg/dL (ref 0.3–1.2)
Total Protein: 7.3 g/dL (ref 6.5–8.1)

## 2022-09-30 LAB — I-STAT BETA HCG BLOOD, ED (MC, WL, AP ONLY): I-stat hCG, quantitative: <5 m[IU]/mL (ref <5)

## 2022-09-30 LAB — LIPASE, BLOOD: Lipase: 31 U/L (ref 11–51)

## 2022-09-30 LAB — TROPONIN I (HIGH SENSITIVITY): Troponin I (High Sensitivity): 5 ng/L (ref <18)

## 2022-09-30 MED ORDER — IOHEXOL 300 MG/ML  SOLN
100.0000 mL | Freq: Once | INTRAMUSCULAR | Status: AC | PRN
  Administered 2022-09-30: 100 mL via INTRAVENOUS

## 2022-09-30 MED ORDER — CYCLOBENZAPRINE HCL 10 MG PO TABS
10.0000 mg | ORAL_TABLET | Freq: Once | ORAL | Status: AC
  Administered 2022-09-30: 10 mg via ORAL
  Filled 2022-09-30: qty 1

## 2022-09-30 MED ORDER — CYCLOBENZAPRINE HCL 10 MG PO TABS: 10.0000 mg | ORAL_TABLET | Freq: Two times a day (BID) | ORAL | 0 refills | Status: AC | PRN

## 2022-09-30 MED ORDER — AMOXICILLIN 500 MG PO CAPS: 500.0000 mg | ORAL_CAPSULE | Freq: Three times a day (TID) | ORAL | 0 refills | Status: DC

## 2022-09-30 MED ORDER — PREDNISONE 20 MG PO TABS: 40.0000 mg | ORAL_TABLET | Freq: Every day | ORAL | 0 refills | Status: DC

## 2022-09-30 MED ORDER — FLUTICASONE PROPIONATE 50 MCG/ACT NA SUSP: 1.0000 | Freq: Every day | NASAL | 2 refills | Status: DC

## 2022-09-30 MED ORDER — CETIRIZINE HCL 10 MG PO TABS: 10.0000 mg | ORAL_TABLET | Freq: Every day | ORAL | 2 refills | Status: DC

## 2022-09-30 MED ORDER — OXYCODONE-ACETAMINOPHEN 5-325 MG PO TABS
1.0000 | ORAL_TABLET | Freq: Once | ORAL | Status: AC
  Administered 2022-09-30: 1 via ORAL
  Filled 2022-09-30: qty 1

## 2022-09-30 MED ORDER — KETOROLAC TROMETHAMINE 15 MG/ML IJ SOLN
15.0000 mg | Freq: Once | INTRAMUSCULAR | Status: DC
  Filled 2022-09-30: qty 1

## 2022-09-30 MED ORDER — OXYCODONE-ACETAMINOPHEN 5-325 MG PO TABS: 1.0000 | ORAL_TABLET | Freq: Four times a day (QID) | ORAL | 0 refills | Status: DC | PRN

## 2022-09-30 NOTE — ED Triage Notes (Signed)
Patient states she has been feeling unwell for a while. Today she was having back pain and  RLQ abdominal pain. Also states she is short of breath occasionally and has sharp chest pain when she lays down. Patient had a collapsed lung 5 years ago that resulted in surgery. Patient states the pain is coming from the same area.

## 2022-09-30 NOTE — ED Provider Notes (Signed)
[place] EMERGENCY DEPARTMENT AT [facility] Provider Note   CSN: [id number] Arrival date & time: 09/30/22  2536     History  Chief Complaint  Patient presents with   Abdominal Pain    Suzanne Morrison is a 42 y.o. female.  Patient is a 42 year old female with a history of prior spontaneous pneumothorax status post chest tube who takes no medications regularly does not have a PCP and comes in today with multiple complaints.  Patient reports that she has not been able to see a doctor for months because of transportation issues and so she has a lot of things going on but they have all been going on for some time.  She reports that she has been having pain all over her body for quite some time now especially in the lower back.  It bothers her all day long seems to be worse at work when she is on her feet but does not radiate down her legs.  She takes Tylenol and ibuprofen for this pain but reports it just does not help.  She also reports that she has been having intermittent sharp right-sided chest pain for the last 5 years intermittently ever since she had a pneumothorax.  She does use cigarettes but denies new productive cough.  She feels short of breath frequently is worse when she lays down at night and has significant nasal congestion.  She reports she can almost never breathe out of her nose is constantly blowing her nose snores at night and even wakes up worse in the morning.  She said some years ago she tried a nasal spray but felt like it made it worse so she does not use anything.  She does not take any allergy medications.  She also has had some issues with her teeth and has pain in the left upper front tooth that has been intermittent.  Her final complaint is of some vaginal discharge over the last few days as well as some pain in her right lower quadrant.  She cannot recall how long the pain in her right lower quadrant has been going on but it has been more than a month.  She  has not been sexually active for at least 8 months denies any dysuria.  The history is provided by the patient.  Abdominal Pain      Home Medications Prior to Admission medications   Medication Sig Start Date End Date Taking? Authorizing Provider  amoxicillin (AMOXIL) 500 MG capsule Take 1 capsule (500 mg total) by mouth 3 (three) times daily. 09/30/22  Yes Gwyneth Sprout, MD  cetirizine (ZYRTEC ALLERGY) 10 MG tablet Take 1 tablet (10 mg total) by mouth daily. 09/30/22  Yes Gwyneth Sprout, MD  cyclobenzaprine (FLEXERIL) 10 MG tablet Take 1 tablet (10 mg total) by mouth 2 (two) times daily as needed for muscle spasms. 09/30/22  Yes Caily Rakers, Alphonzo Lemmings, MD  fluticasone (FLONASE) 50 MCG/ACT nasal spray Place 1 spray into both nostrils daily. 09/30/22  Yes Gwyneth Sprout, MD  oxyCODONE-acetaminophen (PERCOCET/ROXICET) 5-325 MG tablet Take 1 tablet by mouth every 6 (six) hours as needed for severe pain. 09/30/22  Yes Genasis Zingale, Alphonzo Lemmings, MD  predniSONE (DELTASONE) 20 MG tablet Take 2 tablets (40 mg total) by mouth daily. 09/30/22  Yes Hobson Lax, Alphonzo Lemmings, MD  albuterol (VENTOLIN HFA) 108 (90 Base) MCG/ACT inhaler Inhale 2 puffs into the lungs every 6 (six) hours as needed for wheezing or shortness of breath. 09/26/20   Mannie Stabile, PA-C  levofloxacin (  LEVAQUIN) 500 MG tablet Take 1 tablet (500 mg total) by mouth daily. 05/24/21   Wynetta Fines, MD      Allergies    Propoxyphene    Review of Systems   Review of Systems  Gastrointestinal:  Positive for abdominal pain.    Physical Exam Updated Vital Signs BP (!) 140/97   Pulse 89   Temp 99.3 F (37.4 C) (Oral)   Resp 19   Ht 6\' 1"  (1.854 m)   Wt 108.9 kg   LMP 09/21/2022   SpO2 100%   BMI 31.68 kg/m  Physical Exam Vitals and nursing note reviewed.  Constitutional:      General: She is not in acute distress.    Appearance: She is well-developed.  HENT:     Head: Normocephalic and atraumatic.     Right Ear: Tympanic  membrane normal.     Left Ear: A middle ear effusion is present.     Nose: Mucosal edema present.     Right Turbinates: Enlarged, swollen and pale.     Left Turbinates: Enlarged, swollen and pale.     Left Sinus: Maxillary sinus tenderness and frontal sinus tenderness present.     Mouth/Throat:   Eyes:     Pupils: Pupils are equal, round, and reactive to light.  Cardiovascular:     Rate and Rhythm: Normal rate and regular rhythm.     Heart sounds: Normal heart sounds. No murmur heard.    No friction rub.  Pulmonary:     Effort: Pulmonary effort is normal.     Breath sounds: Normal breath sounds. No wheezing or rales.  Abdominal:     General: Bowel sounds are normal. There is no distension.     Palpations: Abdomen is soft.     Tenderness: There is abdominal tenderness in the right lower quadrant. There is no right CVA tenderness, left CVA tenderness, guarding or rebound.  Musculoskeletal:        General: Tenderness present. Normal range of motion.     Lumbar back: Tenderness and bony tenderness present.     Comments: No edema  Skin:    General: Skin is warm and dry.     Findings: No rash.  Neurological:     Mental Status: She is alert and oriented to person, place, and time. Mental status is at baseline.     Cranial Nerves: No cranial nerve deficit.  Psychiatric:        Behavior: Behavior normal.     ED Results / Procedures / Treatments   Labs (all labs ordered are listed, but only abnormal results are displayed) Labs Reviewed  COMPREHENSIVE METABOLIC PANEL - Abnormal; Notable for the following components:      Result Value   Potassium 3.3 (*)    Calcium 8.7 (*)    All other components within normal limits  CBC - Abnormal; Notable for the following components:   Hemoglobin 10.8 (*)    HCT 35.2 (*)    MCH 25.6 (*)    RDW 16.2 (*)    All other components within normal limits  LIPASE, BLOOD  URINALYSIS, ROUTINE W REFLEX MICROSCOPIC  I-STAT BETA HCG BLOOD, ED ([facility], [facility],  [facility] ONLY)  TROPONIN I (HIGH SENSITIVITY)    EKG EKG Interpretation  Date/Time:  Wednesday September 30 2022 19:01:16 EDT Ventricular Rate:  92 PR Interval:  147 QRS Duration: 86 QT Interval:  379 QTC Calculation: 469 R Axis:   25 Text Interpretation: Sinus rhythm Consider right  atrial enlargement Borderline T wave abnormalities No significant change since last tracing Confirmed by Gwyneth Sprout ([id number]) on 09/30/2022 8:15:11 PM  Radiology CT ABDOMEN PELVIS W CONTRAST  Result Date: 09/30/2022 CLINICAL DATA:  Right lower quadrant pain. EXAM: CT ABDOMEN AND PELVIS WITH CONTRAST TECHNIQUE: Multidetector CT imaging of the abdomen and pelvis was performed using the standard protocol following bolus administration of intravenous contrast. RADIATION DOSE REDUCTION: This exam was performed according to the departmental dose-optimization program which includes automated exposure control, adjustment of the mA and/or kV according to patient size and/or use of iterative reconstruction technique. CONTRAST:  OMNIPAQUE IOHEXOL 300 MG/ML  SOLN COMPARISON:  None Available. FINDINGS: Lower chest: No acute abnormality. Hepatobiliary: No focal liver abnormality is seen. The gallbladder is contracted without evidence of gallstones, gallbladder wall thickening, or biliary dilatation. Pancreas: Unremarkable. No pancreatic ductal dilatation or surrounding inflammatory changes. Spleen: Normal in size without focal abnormality. Adrenals/Urinary Tract: A 2.0 cm x 1.1 cm low-attenuation (approximately 39.69 Hounsfield units) right adrenal mass is seen. The left adrenal gland is unremarkable. Kidneys are normal, without renal calculi, focal lesion, or hydronephrosis. The urinary bladder is poorly distended and subsequently limited in evaluation. Mild diffuse urinary bladder wall thickening is seen. Stomach/Bowel: Stomach is within normal limits. Appendix appears normal. No evidence of bowel wall thickening, distention, or  inflammatory changes. Vascular/Lymphatic: No significant vascular findings are present. No enlarged abdominal or pelvic lymph nodes. Reproductive: Uterus and bilateral adnexa are unremarkable. Other: No abdominal wall hernia or abnormality. No abdominopelvic ascites. Musculoskeletal: There is mild levoscoliosis of the lumbar spine. No acute osseous abnormality is identified. IMPRESSION: 1. 2.0 cm x 1.1 cm low-attenuation right adrenal mass, likely consistent with an adrenal adenoma. Correlation with nonemergent adrenal protocol CT is recommended. This recommendation follows ACR consensus guidelines: Management of Incidental Adrenal Masses: A White Paper of the ACR Incidental Findings Committee. J Am Coll Radiol 2017;14:1038-1044. 2. Mild diffuse urinary bladder wall thickening which may be secondary to poor distention. Correlation with urinalysis is recommended to exclude the presence of cystitis. Electronically Signed   By: Aram Candela M.D.   On: 09/30/2022 21:13   DG Chest 2 View  Result Date: 09/30/2022 CLINICAL DATA:  Shortness of breath EXAM: CHEST - 2 VIEW COMPARISON:  08/17/2021 FINDINGS: Cardiac size is within normal limits. There are no signs of pulmonary edema or focal pulmonary consolidation. There is blunting of right lateral CP angle with no significant change, possibly pleural thickening. There is no significant pleural effusion or pneumothorax. Dextroscoliosis is seen in thoracic spine. IMPRESSION: No active cardiopulmonary disease. Electronically Signed   By: Ernie Avena M.D.   On: 09/30/2022 20:58    Procedures Procedures    Medications Ordered in ED Medications  ketorolac (TORADOL) 15 MG/ML injection 15 mg (15 mg Intravenous Patient Refused/Not Given 09/30/22 2112)  cyclobenzaprine (FLEXERIL) tablet 10 mg (10 mg Oral Given 09/30/22 2120)  iohexol (OMNIPAQUE) 300 MG/ML solution 100 mL (100 mLs Intravenous Contrast Given 09/30/22 2054)  oxyCODONE-acetaminophen  (PERCOCET/ROXICET) 5-325 MG per tablet 1 tablet (1 tablet Oral Given 09/30/22 2125)    ED Course/ Medical Decision Making/ A&P                             Medical Decision Making Amount and/or Complexity of Data Reviewed Labs: ordered. Decision-making details documented in ED Course. Radiology: ordered and independent interpretation performed. Decision-making details documented in ED Course. ECG/medicine tests: ordered and independent interpretation  performed. Decision-making details documented in ED Course.  Risk OTC drugs. Prescription drug management.   Pt presenting today with a complaint that caries a high risk for morbidity and mortality.  Patient is here with numerous complaints.  Ultimately feel the patient's shortness of breath is related to severe nasal congestion.  Nasal turbinates are swollen shut bilaterally.  She has trace effusion behind the left TM findings concerning for allergic rhinitis.  Low suspicion for sinusitis or bacterial infection.  Patient placed on Flonase and Zyrtec.  Secondly complaining of occasional sharp chest pain in the setting of having a prior pneumothorax and chest tube.  I have independently visualized and interpreted pt's images today.  Chest x-ray today within normal limits without evidence of fluid accumulation or pneumonia or recurrent pneumothorax.  I independently interpreted patient's EKG which was unchanged without significant findings.  Low suspicion for any cardiac cause based on patient's symptoms today.  Low suspicion for PE and patient is PERC negative.  Patient is also complaining of abdominal pain and vaginal discharge.  Low suspicion for STI or pregnancy as patient has not been sexually active in over 8 months.  I independently interpreted patient's labs and her CBC, CMP, lipase, hCG and urine all without acute findings.  Low suspicion for UTI, pregnancy, pancreatitis, hepatitis.  Concern for possible ovarian cyst or mass as a cause of her  pain.  Also back pain seems more musculoskeletal in nature.  CT of the abdomen shows a adrenal mass most likely an adenoma and then some mild bladder wall thickening but no evidence of UTI today most likely related to poor distention.  All of these findings were discussed with the patient.  She ultimately needs a PCP in transition of care consult was placed to help her find that she was also given resources.  Prescription sent to her pharmacy patient encouraged to follow-up with PCP.  Offered to do a pelvic exam to further evaluate for the discharge but patient reports may be another time she would like to leave.          Final Clinical Impression(s) / ED Diagnoses Final diagnoses:  Chronic bilateral low back pain without sciatica  Adrenal adenoma, right  Non-seasonal allergic rhinitis, unspecified trigger  Dental caries    Rx / DC Orders ED Discharge Orders          Ordered    predniSONE (DELTASONE) 20 MG tablet  Daily        09/30/22 2156    fluticasone (FLONASE) 50 MCG/ACT nasal spray  Daily        09/30/22 2156    amoxicillin (AMOXIL) 500 MG capsule  3 times daily        09/30/22 2156    oxyCODONE-acetaminophen (PERCOCET/ROXICET) 5-325 MG tablet  Every 6 hours PRN        09/30/22 2156    cyclobenzaprine (FLEXERIL) 10 MG tablet  2 times daily PRN        09/30/22 2156    cetirizine (ZYRTEC ALLERGY) 10 MG tablet  Daily        09/30/22 2156              Gwyneth Sprout, MD 09/30/22 2215

## 2022-09-30 NOTE — Discharge Instructions (Addendum)
Somebody from case management should contact you about getting a regular doctor.  Prescriptions were sent to your pharmacy.  You do have that small cyst on your adrenal gland that we will need to be monitored in the future but I doubt that that is the cause of your pain and it is most likely coming from your spine.  Also a list of doctors in the area was provided on your discharge paperwork.

## 2022-11-18 ENCOUNTER — Emergency Department (HOSPITAL_COMMUNITY)
Admission: EM | Admit: 2022-11-18 | Discharge: 2022-11-18 | Disposition: A | Discharge status: Home / Self Care | Payer: Self-pay | Attending: Emergency Medicine | Admitting: Emergency Medicine

## 2022-11-18 ENCOUNTER — Other Ambulatory Visit: Payer: Self-pay

## 2022-11-18 ENCOUNTER — Encounter (HOSPITAL_COMMUNITY): Payer: Self-pay | Admitting: Emergency Medicine

## 2022-11-18 DIAGNOSIS — K0889 Other specified disorders of teeth and supporting structures: Secondary | ICD-10-CM | POA: Insufficient documentation

## 2022-11-18 MED ORDER — ETODOLAC 400 MG PO TABS: 400.0000 mg | ORAL_TABLET | Freq: Two times a day (BID) | ORAL | 0 refills | Status: DC

## 2022-11-18 MED ORDER — KETOROLAC TROMETHAMINE 15 MG/ML IJ SOLN
15.0000 mg | Freq: Once | INTRAMUSCULAR | Status: AC
  Administered 2022-11-18: 15 mg via INTRAMUSCULAR
  Filled 2022-11-18: qty 1

## 2022-11-18 MED ORDER — AMOXICILLIN-POT CLAVULANATE 875-125 MG PO TABS: 1.0000 | ORAL_TABLET | Freq: Two times a day (BID) | ORAL | 0 refills | Status: DC

## 2022-11-18 MED ORDER — HYDROCODONE-ACETAMINOPHEN 5-325 MG PO TABS
1.0000 | ORAL_TABLET | Freq: Once | ORAL | Status: AC
  Administered 2022-11-18: 1 via ORAL
  Filled 2022-11-18: qty 1

## 2022-11-18 NOTE — Discharge Instructions (Addendum)
I sent antibiotic into the pharmacy for you.  Follow-up with your dentist.  I attached information for the on-call dentist in case you are unable to get in with your dentist sooner.  For any concerning symptoms return to the emergency room.

## 2022-11-18 NOTE — ED Triage Notes (Signed)
Patient coming to ED for evaluation of upper L dental pain x several months.  Reports she has a dental appointment scheduled for August 26th.  States pain has been increasing and "it hurts to eat or drink."  No reports of fever.  Has known broken tooth.

## 2022-11-18 NOTE — ED Provider Notes (Signed)
EMERGENCY DEPARTMENT AT [facility] Provider Note   CSN: [id number] Arrival date & time: 11/18/22  1920     History  Chief Complaint  Patient presents with   Dental Pain    Suzanne Morrison is a 42 y.o. female.  42 year old female presents today for evaluation of left-sided dental pain.  Ongoing for couple months but worse in the past couple days.  States she has a dentist appointment scheduled for to have an extraction done.  Denies fever.  Has taken ibuprofen without significant improvement.  No other concerns.  The history is provided by the patient. No language interpreter was used.       Home Medications Prior to Admission medications   Medication Sig Start Date End Date Taking? Authorizing Provider  amoxicillin-clavulanate (AUGMENTIN) 875-125 MG tablet Take 1 tablet by mouth every 12 (twelve) hours. 11/18/22  Yes Karie Mainland, Jovannie Ulibarri, PA-C  etodolac (LODINE) 400 MG tablet Take 1 tablet (400 mg total) by mouth 2 (two) times daily. 11/18/22  Yes Amahd Morino, PA-C  albuterol (VENTOLIN HFA) 108 (90 Base) MCG/ACT inhaler Inhale 2 puffs into the lungs every 6 (six) hours as needed for wheezing or shortness of breath. 09/26/20   Mannie Stabile, PA-C  cetirizine (ZYRTEC ALLERGY) 10 MG tablet Take 1 tablet (10 mg total) by mouth daily. 09/30/22   Gwyneth Sprout, MD  cyclobenzaprine (FLEXERIL) 10 MG tablet Take 1 tablet (10 mg total) by mouth 2 (two) times daily as needed for muscle spasms. 09/30/22   Gwyneth Sprout, MD  fluticasone (FLONASE) 50 MCG/ACT nasal spray Place 1 spray into both nostrils daily. 09/30/22   Gwyneth Sprout, MD  levofloxacin (LEVAQUIN) 500 MG tablet Take 1 tablet (500 mg total) by mouth daily. 05/24/21   Wynetta Fines, MD  oxyCODONE-acetaminophen (PERCOCET/ROXICET) 5-325 MG tablet Take 1 tablet by mouth every 6 (six) hours as needed for severe pain. 09/30/22   Gwyneth Sprout, MD  predniSONE (DELTASONE) 20 MG tablet Take 2 tablets (40 mg total)  by mouth daily. 09/30/22   Gwyneth Sprout, MD      Allergies    Propoxyphene    Review of Systems   Review of Systems  Constitutional:  Negative for fever.  HENT:  Positive for dental problem. Negative for drooling, sore throat, trouble swallowing and voice change.   All other systems reviewed and are negative.   Physical Exam Updated Vital Signs BP (!) 166/106 (BP Location: Right Arm)   Pulse (!) 102   Temp 98.3 F (36.8 C) (Oral)   Resp 18   Ht 6\' 1"  (1.854 m)   Wt 113.4 kg   SpO2 100%   BMI 32.98 kg/m  Physical Exam Vitals and nursing note reviewed.  Constitutional:      General: She is not in acute distress.    Appearance: Normal appearance. She is not ill-appearing.  HENT:     Head: Normocephalic and atraumatic.     Nose: Nose normal.     Mouth/Throat:     Comments: Generalized poor dentition noted.  No facial swelling.  No evidence of peritonsillar abscess, retropharyngeal abscess, Ludwig's angina, or trismus.  No apparent dental abscess noted. Eyes:     Conjunctiva/sclera: Conjunctivae normal.  Pulmonary:     Effort: Pulmonary effort is normal. No respiratory distress.  Musculoskeletal:        General: No deformity.  Skin:    Findings: No rash.  Neurological:     Mental Status: She is alert.  ED Results / Procedures / Treatments   Labs (all labs ordered are listed, but only abnormal results are displayed) Labs Reviewed - No data to display  EKG None  Radiology No results found.  Procedures Procedures    Medications Ordered in ED Medications  HYDROcodone-acetaminophen (NORCO/VICODIN) 5-325 MG per tablet 1 tablet (has no administration in time range)  ketorolac (TORADOL) 15 MG/ML injection 15 mg (has no administration in time range)    ED Course/ Medical Decision Making/ A&P                                 Medical Decision Making Risk Prescription drug management.   Patient presents with dental pain.  Ongoing for months but worse  in the past couple days.  No fever.  No apparent dental abscess.  No other concerning findings on exam.  Given pain medication emergency department.  Prescribed Augmentin and Lodine.  Dental information given.  Discussed importance of dental follow-up.   Final Clinical Impression(s) / ED Diagnoses Final diagnoses:  Pain, dental    Rx / DC Orders ED Discharge Orders          Ordered    etodolac (LODINE) 400 MG tablet  2 times daily        11/18/22 2058    amoxicillin-clavulanate (AUGMENTIN) 875-125 MG tablet  Every 12 hours        11/18/22 2058              Marita Kansas, PA-C 11/18/22 2102    Charlynne Pander, MD 11/18/22 2306

## 2022-12-31 ENCOUNTER — Other Ambulatory Visit: Payer: Self-pay

## 2022-12-31 ENCOUNTER — Emergency Department (HOSPITAL_COMMUNITY)
Admission: EM | Admit: 2022-12-31 | Discharge: 2022-12-31 | Disposition: A | Discharge status: Home / Self Care | Payer: Self-pay | Attending: Emergency Medicine | Admitting: Emergency Medicine

## 2022-12-31 DIAGNOSIS — K0889 Other specified disorders of teeth and supporting structures: Secondary | ICD-10-CM

## 2022-12-31 DIAGNOSIS — K029 Dental caries, unspecified: Secondary | ICD-10-CM | POA: Insufficient documentation

## 2022-12-31 MED ORDER — NAPROXEN 500 MG PO TABS: 500.0000 mg | ORAL_TABLET | Freq: Two times a day (BID) | ORAL | 0 refills | Status: AC

## 2022-12-31 MED ORDER — HYDROCODONE-ACETAMINOPHEN 5-325 MG PO TABS
1.0000 | ORAL_TABLET | Freq: Once | ORAL | Status: AC
  Administered 2022-12-31: 1 via ORAL
  Filled 2022-12-31: qty 1

## 2022-12-31 MED ORDER — OXYCODONE HCL 5 MG PO TABS: 5.0000 mg | ORAL_TABLET | ORAL | 0 refills | Status: AC | PRN

## 2022-12-31 MED ORDER — AMOXICILLIN 500 MG PO CAPS: 500.0000 mg | ORAL_CAPSULE | Freq: Three times a day (TID) | ORAL | 0 refills | Status: DC

## 2022-12-31 MED ORDER — AMOXICILLIN 500 MG PO CAPS
500.0000 mg | ORAL_CAPSULE | Freq: Once | ORAL | Status: AC
  Administered 2022-12-31: 500 mg via ORAL
  Filled 2022-12-31: qty 1

## 2022-12-31 NOTE — ED Triage Notes (Signed)
Pt has dental pain to top back left tooth. Had an appt with dentist but appt was canceled. Pain is getting worse. No bleeding from tooth.

## 2022-12-31 NOTE — Discharge Instructions (Signed)
Follow-up outpatient with a dentist  I written you for a short course of pain medicine.  Take as prescribed. Do not drive or operate heavy machinery or make lif or death decisions while taking this medication.  Return for new or worsening symptoms

## 2022-12-31 NOTE — ED Provider Notes (Signed)
[place] EMERGENCY DEPARTMENT AT [facility] Provider Note   CSN: [id number] Arrival date & time: 12/31/22  1810    History  Chief Complaint  Patient presents with   Dental Pain    Shanelle Makowski is a 42 y.o. female here for evaluation of dental pain.  Was supposed to have a follow-up visit with a dentist due to known fractured tooth however was canceled as her dental insurance did not go through.  She has pain to her left upper dentition.  Worse with hot and cold liquids.  No fever.  No facial swelling.  Taking Tylenol ibuprofen at home without relief.  No sore throat, trouble swallowing, no facial swelling, change in voice, neck pain, neck stiffness  HPI     Home Medications Prior to Admission medications   Medication Sig Start Date End Date Taking? Authorizing Provider  amoxicillin (AMOXIL) 500 MG capsule Take 1 capsule (500 mg total) by mouth 3 (three) times daily. 12/31/22  Yes Rafaelita Foister A, PA-C  naproxen (NAPROSYN) 500 MG tablet Take 1 tablet (500 mg total) by mouth 2 (two) times daily. 12/31/22  Yes Gelena Klosinski A, PA-C  oxyCODONE (ROXICODONE) 5 MG immediate release tablet Take 1 tablet (5 mg total) by mouth every 4 (four) hours as needed for severe pain. 12/31/22  Yes Shakeila Pfarr A, PA-C  albuterol (VENTOLIN HFA) 108 (90 Base) MCG/ACT inhaler Inhale 2 puffs into the lungs every 6 (six) hours as needed for wheezing or shortness of breath. 09/26/20   Mannie Stabile, PA-C  cetirizine (ZYRTEC ALLERGY) 10 MG tablet Take 1 tablet (10 mg total) by mouth daily. 09/30/22   Gwyneth Sprout, MD  cyclobenzaprine (FLEXERIL) 10 MG tablet Take 1 tablet (10 mg total) by mouth 2 (two) times daily as needed for muscle spasms. 09/30/22   Gwyneth Sprout, MD  fluticasone (FLONASE) 50 MCG/ACT nasal spray Place 1 spray into both nostrils daily. 09/30/22   Gwyneth Sprout, MD  levofloxacin (LEVAQUIN) 500 MG tablet Take 1 tablet (500 mg total) by mouth daily. 05/24/21    Wynetta Fines, MD  predniSONE (DELTASONE) 20 MG tablet Take 2 tablets (40 mg total) by mouth daily. 09/30/22   Gwyneth Sprout, MD      Allergies    Propoxyphene    Review of Systems   Review of Systems  Constitutional: Negative.   HENT:  Positive for dental problem. Negative for congestion, drooling, facial swelling, sore throat, trouble swallowing and voice change.   Respiratory: Negative.    Cardiovascular: Negative.   Gastrointestinal: Negative.   Skin: Negative.   Neurological: Negative.   All other systems reviewed and are negative.   Physical Exam Updated Vital Signs BP (!) 175/107 (BP Location: Right Arm)   Pulse 90   Temp 97.8 F (36.6 C) (Oral)   Resp 17   Ht 6\' 1"  (1.854 m)   Wt 108 kg   LMP 12/16/2022   SpO2 98%   BMI 31.40 kg/m  Physical Exam Vitals and nursing note reviewed.  Constitutional:      General: She is not in acute distress.    Appearance: She is well-developed. She is not ill-appearing, toxic-appearing or diaphoretic.  HENT:     Head: Normocephalic and atraumatic.     Jaw: There is normal jaw occlusion.     Comments: No drooling, dysphagia or trismus.  Submandibular area soft, no erythema, induration    Mouth/Throat:     Lips: Pink.     Mouth: Mucous membranes  are moist.     Dentition: Abnormal dentition. Dental tenderness and dental caries present. No gingival swelling or dental abscesses.     Pharynx: Oropharynx is clear. Uvula midline.     Tonsils: No tonsillar exudate or tonsillar abscesses. 0 on the right. 0 on the left.      Comments: Poor dentition, missing teeth, dental caries, teeth eroded to gumline.  Sublingual area soft, tongue midline.  Uvula midline, tonsils without exudate, edema Eyes:     Pupils: Pupils are equal, round, and reactive to light.  Neck:     Trachea: Trachea and phonation normal.     Comments: No siffness, neck rigidity.  No change in voice Cardiovascular:     Rate and Rhythm: Normal rate and regular  rhythm.     Pulses: Normal pulses.     Heart sounds: Normal heart sounds.  Pulmonary:     Effort: Pulmonary effort is normal. No respiratory distress.  Abdominal:     General: There is no distension.     Palpations: Abdomen is soft.  Musculoskeletal:        General: Normal range of motion.     Cervical back: Full passive range of motion without pain, normal range of motion and neck supple.  Skin:    General: Skin is warm and dry.     Capillary Refill: Capillary refill takes less than 2 seconds.     Comments: No edema, erythema, warmth, fluctuance or induration  Neurological:     General: No focal deficit present.     Mental Status: She is alert and oriented to person, place, and time.     ED Results / Procedures / Treatments   Labs (all labs ordered are listed, but only abnormal results are displayed) Labs Reviewed - No data to display  EKG None  Radiology No results found.  Procedures Procedures    Medications Ordered in ED Medications  HYDROcodone-acetaminophen (NORCO/VICODIN) 5-325 MG per tablet 1 tablet (1 tablet Oral Given 12/31/22 1912)  amoxicillin (AMOXIL) capsule 500 mg (500 mg Oral Given 12/31/22 1912)    ED Course/ Medical Decision Making/ A&P   42 year old here for evaluation of dental pain.  Worse with hot and cold liquids.  She is overall poor dentition, multiple missing teeth, teeth eroded to the gumline.  She has no obvious abscess.  No evidence of Ludwig's angina, low suspicion for deep space infection.  She has no neck stiffness or neck rigidity.  No meningismus.  Appears clinically well-hydrated.  She is tolerating her secretions.  Will have her follow-up outpatient.  Started on antibiotics, management of her pain.  The patient has been appropriately medically screened and/or stabilized in the ED. I have low suspicion for any other emergent medical condition which would require further screening, evaluation or treatment in the ED or require inpatient  management.  Patient is hemodynamically stable and in no acute distress.  Patient able to ambulate in department prior to ED.  Evaluation does not show acute pathology that would require ongoing or additional emergent interventions while in the emergency department or further inpatient treatment.  I have discussed the diagnosis with the patient and answered all questions.  Pain is been managed while in the emergency department and patient has no further complaints prior to discharge.  Patient is comfortable with plan discussed in room and is stable for discharge at this time.  I have discussed strict return precautions for returning to the emergency department.  Patient was encouraged to follow-up with  PCP/specialist refer to at discharge.                                  Medical Decision Making Amount and/or Complexity of Data Reviewed External Data Reviewed: labs, radiology and notes.  Risk OTC drugs. Prescription drug management. Diagnosis or treatment significantly limited by social determinants of health.           Final Clinical Impression(s) / ED Diagnoses Final diagnoses:  Pain, dental    Rx / DC Orders ED Discharge Orders          Ordered    oxyCODONE (ROXICODONE) 5 MG immediate release tablet  Every 4 hours PRN        12/31/22 1910    naproxen (NAPROSYN) 500 MG tablet  2 times daily        12/31/22 1910    amoxicillin (AMOXIL) 500 MG capsule  3 times daily        12/31/22 1910              Sarkis Rhines A, PA-C 12/31/22 2003    Loetta Rough, MD 12/31/22 2012

## 2023-02-09 ENCOUNTER — Emergency Department (HOSPITAL_COMMUNITY)
Admission: EM | Admit: 2023-02-09 | Discharge: 2023-02-09 | Disposition: A | Discharge status: Home / Self Care | Payer: Self-pay | Attending: Emergency Medicine | Admitting: Emergency Medicine

## 2023-02-09 ENCOUNTER — Other Ambulatory Visit: Payer: Self-pay

## 2023-02-09 DIAGNOSIS — K029 Dental caries, unspecified: Secondary | ICD-10-CM | POA: Insufficient documentation

## 2023-02-09 DIAGNOSIS — K0889 Other specified disorders of teeth and supporting structures: Secondary | ICD-10-CM | POA: Diagnosis present

## 2023-02-09 DIAGNOSIS — K0381 Cracked tooth: Secondary | ICD-10-CM | POA: Insufficient documentation

## 2023-02-09 MED ORDER — AMOXICILLIN 500 MG PO CAPS: 500.0000 mg | ORAL_CAPSULE | Freq: Three times a day (TID) | ORAL | 0 refills | Status: AC

## 2023-02-09 MED ORDER — NAPROXEN 500 MG PO TABS
500.0000 mg | ORAL_TABLET | Freq: Once | ORAL | Status: AC
  Administered 2023-02-09: 500 mg via ORAL
  Filled 2023-02-09: qty 1

## 2023-02-09 NOTE — ED Provider Notes (Signed)
[place] EMERGENCY DEPARTMENT AT [facility] Provider Note   CSN: [id number] Arrival date & time: 02/09/23  1722     History {Add pertinent medical, surgical, social history, OB history to HPI:1} Chief Complaint  Patient presents with   Dental Pain   Cough    Suzanne Morrison is a 42 y.o. female with past medical history of poor dentition presents emergency department for evaluation of right upper dental pain   Dental Pain Cough      Home Medications Prior to Admission medications   Medication Sig Start Date End Date Taking? Authorizing Provider  albuterol (VENTOLIN HFA) 108 (90 Base) MCG/ACT inhaler Inhale 2 puffs into the lungs every 6 (six) hours as needed for wheezing or shortness of breath. 09/26/20   Mannie Stabile, PA-C  amoxicillin (AMOXIL) 500 MG capsule Take 1 capsule (500 mg total) by mouth 3 (three) times daily. 12/31/22   Henderly, Britni A, PA-C  cetirizine (ZYRTEC ALLERGY) 10 MG tablet Take 1 tablet (10 mg total) by mouth daily. 09/30/22   Gwyneth Sprout, MD  cyclobenzaprine (FLEXERIL) 10 MG tablet Take 1 tablet (10 mg total) by mouth 2 (two) times daily as needed for muscle spasms. 09/30/22   Gwyneth Sprout, MD  fluticasone (FLONASE) 50 MCG/ACT nasal spray Place 1 spray into both nostrils daily. 09/30/22   Gwyneth Sprout, MD  levofloxacin (LEVAQUIN) 500 MG tablet Take 1 tablet (500 mg total) by mouth daily. 05/24/21   Wynetta Fines, MD  naproxen (NAPROSYN) 500 MG tablet Take 1 tablet (500 mg total) by mouth 2 (two) times daily. 12/31/22   Henderly, Britni A, PA-C  oxyCODONE (ROXICODONE) 5 MG immediate release tablet Take 1 tablet (5 mg total) by mouth every 4 (four) hours as needed for severe pain. 12/31/22   Henderly, Britni A, PA-C  predniSONE (DELTASONE) 20 MG tablet Take 2 tablets (40 mg total) by mouth daily. 09/30/22   Gwyneth Sprout, MD      Allergies    Propoxyphene    Review of Systems   Review of Systems  Respiratory:   Positive for cough.     Physical Exam Updated Vital Signs BP (!) 152/99   Pulse 100   Temp 97.9 F (36.6 C) (Oral)   Resp 18   Ht 6\' 1"  (1.854 m)   Wt 108.9 kg   SpO2 100%   BMI 31.66 kg/m  Physical Exam  ED Results / Procedures / Treatments   Labs (all labs ordered are listed, but only abnormal results are displayed) Labs Reviewed - No data to display  EKG None  Radiology No results found.  Procedures Procedures  {Document cardiac monitor, telemetry assessment procedure when appropriate:1}  Medications Ordered in ED Medications - No data to display  ED Course/ Medical Decision Making/ A&P   {   Click here for ABCD2, HEART and other calculatorsREFRESH Note before signing :1}                              Medical Decision Making  ***  {Document critical care time when appropriate:1} {Document review of labs and clinical decision tools ie heart score, Chads2Vasc2 etc:1}  {Document your independent review of radiology images, and any outside records:1} {Document your discussion with family members, caretakers, and with consultants:1} {Document social determinants of health affecting pt's care:1} {Document your decision making why or why not admission, treatments were needed:1} Final Clinical Impression(s) / ED Diagnoses Final diagnoses:  None    Rx / DC Orders ED Discharge Orders     None

## 2023-02-09 NOTE — Discharge Instructions (Addendum)
Please pick up amoxicillin at pharmacy and take 1 capsule by mouth 3 times daily for 7 days.  I provided an additional prescription if they do not hold your prescription until you can pick it up.  There is also naproxen at your pharmacy [facility] on [address] that will help with inflammation and pain.  The antibiotics and following up with your dentist is the only thing that will take with pain.  Return to emergency department if you experience pain or swelling underneath of jaw, unable to swallow, deviation of uvula in mouth, abscess/swelling inside mouth

## 2023-02-09 NOTE — ED Notes (Signed)
Patient informed that registration needed to talk to her, she told the RN she did not care about registration and registration would not give her a ride home. She was asked to try to stop at the desk before leaving to catch the bus.

## 2023-02-09 NOTE — ED Triage Notes (Signed)
Pt reports:  Tooth ache (top left) Ongoing 3 months Unable to see dentist due to insurance Appt on the 7th of Nov Cough/Cold

## 2023-04-06 ENCOUNTER — Emergency Department (HOSPITAL_COMMUNITY): Payer: No Typology Code available for payment source

## 2023-04-06 ENCOUNTER — Emergency Department (HOSPITAL_COMMUNITY)
Admission: EM | Admit: 2023-04-06 | Discharge: 2023-04-07 | Disposition: A | Discharge status: Home / Self Care | Payer: No Typology Code available for payment source | Attending: Emergency Medicine | Admitting: Emergency Medicine

## 2023-04-06 ENCOUNTER — Encounter (HOSPITAL_COMMUNITY): Payer: Self-pay

## 2023-04-06 DIAGNOSIS — E876 Hypokalemia: Secondary | ICD-10-CM | POA: Diagnosis not present

## 2023-04-06 DIAGNOSIS — D72829 Elevated white blood cell count, unspecified: Secondary | ICD-10-CM | POA: Diagnosis not present

## 2023-04-06 DIAGNOSIS — R079 Chest pain, unspecified: Secondary | ICD-10-CM | POA: Insufficient documentation

## 2023-04-06 HISTORY — DX: Other pulmonary collapse: J98.19

## 2023-04-06 HISTORY — DX: Other specified postprocedural states: Z98.890

## 2023-04-06 LAB — CBC
HCT: 38.4 % (ref 36.0–46.0)
Hemoglobin: 11.8 g/dL — ABNORMAL LOW (ref 12.0–15.0)
MCH: 25.1 pg — ABNORMAL LOW (ref 26.0–34.0)
MCHC: 30.7 g/dL (ref 30.0–36.0)
MCV: 81.5 fL (ref 80.0–100.0)
Platelets: 379 10*3/uL (ref 150–400)
RBC: 4.71 MIL/uL (ref 3.87–5.11)
RDW: 17.3 % — ABNORMAL HIGH (ref 11.5–15.5)
WBC: 13.8 10*3/uL — ABNORMAL HIGH (ref 4.0–10.5)
nRBC: 0 % (ref 0.0–0.2)

## 2023-04-06 LAB — COMPREHENSIVE METABOLIC PANEL
ALT: 15 U/L (ref 0–44)
AST: 19 U/L (ref 15–41)
Albumin: 3.4 g/dL — ABNORMAL LOW (ref 3.5–5.0)
Alkaline Phosphatase: 62 U/L (ref 38–126)
Anion gap: 6 (ref 5–15)
BUN: 8 mg/dL (ref 6–20)
CO2: 26 mmol/L (ref 22–32)
Calcium: 8.8 mg/dL — ABNORMAL LOW (ref 8.9–10.3)
Chloride: 103 mmol/L (ref 98–111)
Creatinine, Ser: 1.13 mg/dL — ABNORMAL HIGH (ref 0.44–1.00)
GFR, Estimated: >60 mL/min (ref >60)
Glucose, Bld: 88 mg/dL (ref 70–99)
Potassium: 3.4 mmol/L — ABNORMAL LOW (ref 3.5–5.1)
Sodium: 135 mmol/L (ref 135–145)
Total Bilirubin: 0.4 mg/dL (ref <1.2)
Total Protein: 6.7 g/dL (ref 6.5–8.1)

## 2023-04-06 LAB — D-DIMER, QUANTITATIVE: D-Dimer, Quant: 0.73 ug{FEU}/mL — ABNORMAL HIGH (ref 0.00–0.50)

## 2023-04-06 LAB — HCG, SERUM, QUALITATIVE: Preg, Serum: NEGATIVE

## 2023-04-06 LAB — TROPONIN I (HIGH SENSITIVITY)
Troponin I (High Sensitivity): 9 ng/L (ref <18)
Troponin I (High Sensitivity): 9 ng/L

## 2023-04-06 MED ORDER — IOHEXOL 350 MG/ML SOLN
75.0000 mL | Freq: Once | INTRAVENOUS | Status: AC | PRN
  Administered 2023-04-06: 75 mL via INTRAVENOUS

## 2023-04-06 NOTE — ED Triage Notes (Addendum)
Right sided chest pain that started last night radiating to right arm. EMS reports EKG shows bigeminy. Also reports cough, congestion for a few days. EMS gave 324mg  Aspirin.  EMS VS: 150/70 HR 90 CBG 123

## 2023-04-06 NOTE — ED Provider Triage Note (Signed)
Emergency Medicine Provider Triage Evaluation Note  Suzanne Morrison , a 42 y.o. female  was evaluated in triage.  Pt complains of right-sided chest pain that radiates to the right arm that started yesterday.  It is still present.  Is been constant.  No nausea no vomiting EMS had an EKG that showed bigeminy.  EKG here still shows that.  Patient also reports cough and congestion for few days.  EMS gave her aspirin.  No improvement in pain..  Review of Systems  Positive: For the chest pain cough and congestion Negative: Leg swelling  Physical Exam  BP (!) 145/96   Pulse (!) 102   Temp 98 F (36.7 C)   Resp 18   Ht 1.854 m (6\' 1" )   Wt 108.9 kg   SpO2 97%   BMI 31.66 kg/m  Gen:   Awake, no distress   Resp:  Normal effort lungs clear bilaterally  Other:    Medical Decision Making  Medically screening exam initiated at 6:10 PM.  Appropriate orders placed.  Makaleigh Hausch was informed that the remainder of the evaluation will be completed by another provider, this initial triage assessment does not replace that evaluation, and the importance of remaining in the ED until their evaluation is complete.  Patient's initial troponin is good.  Will get D-dimer.  Chest x-ray still pending.  EKG consistent with bigeminy.   Vanetta Mulders, MD 04/06/23 [phone]

## 2023-04-07 MED ORDER — BENZONATATE 100 MG PO CAPS
200.0000 mg | ORAL_CAPSULE | Freq: Once | ORAL | Status: AC
  Administered 2023-04-07: 200 mg via ORAL
  Filled 2023-04-07: qty 2

## 2023-04-07 MED ORDER — BENZONATATE 100 MG PO CAPS: 100.0000 mg | ORAL_CAPSULE | Freq: Three times a day (TID) | ORAL | 0 refills | Status: DC

## 2023-04-07 MED ORDER — OXYCODONE-ACETAMINOPHEN 5-325 MG PO TABS
1.0000 | ORAL_TABLET | Freq: Once | ORAL | Status: AC
  Administered 2023-04-07: 1 via ORAL
  Filled 2023-04-07: qty 1

## 2023-04-07 NOTE — ED Provider Notes (Signed)
[place] EMERGENCY DEPARTMENT AT [facility] Provider Note   CSN: [id number] Arrival date & time: 04/06/23  1558     History Chief Complaint  Patient presents with   Chest Pain    Suzanne Morrison is a 42 y.o. female. Patient without significant past medical history presents to the ED with concerns of chest pain. She reports that she noticed right sided chest pain with radiation into the right arm starting earlier today. Denies any nausea, vomiting, shortness of breath, or exertional dyspnea. No prior cardiac history. Endorses cough, congestion, and some substance use but only forthcoming regarding cigarette use. Denies any IVDU.   Chest Pain      Home Medications Prior to Admission medications   Medication Sig Start Date End Date Taking? Authorizing Provider  benzonatate (TESSALON) 100 MG capsule Take 1 capsule (100 mg total) by mouth every 8 (eight) hours. 04/07/23  Yes Smitty Knudsen, PA-C  albuterol (VENTOLIN HFA) 108 (90 Base) MCG/ACT inhaler Inhale 2 puffs into the lungs every 6 (six) hours as needed for wheezing or shortness of breath. 09/26/20   Mannie Stabile, PA-C  cetirizine (ZYRTEC ALLERGY) 10 MG tablet Take 1 tablet (10 mg total) by mouth daily. 09/30/22   Gwyneth Sprout, MD  cyclobenzaprine (FLEXERIL) 10 MG tablet Take 1 tablet (10 mg total) by mouth 2 (two) times daily as needed for muscle spasms. 09/30/22   Gwyneth Sprout, MD  fluticasone (FLONASE) 50 MCG/ACT nasal spray Place 1 spray into both nostrils daily. 09/30/22   Gwyneth Sprout, MD  levofloxacin (LEVAQUIN) 500 MG tablet Take 1 tablet (500 mg total) by mouth daily. 05/24/21   Wynetta Fines, MD  naproxen (NAPROSYN) 500 MG tablet Take 1 tablet (500 mg total) by mouth 2 (two) times daily. 12/31/22   Henderly, Britni A, PA-C  oxyCODONE (ROXICODONE) 5 MG immediate release tablet Take 1 tablet (5 mg total) by mouth every 4 (four) hours as needed for severe pain. 12/31/22   Henderly, Britni A, PA-C   predniSONE (DELTASONE) 20 MG tablet Take 2 tablets (40 mg total) by mouth daily. 09/30/22   Gwyneth Sprout, MD      Allergies    Propoxyphene    Review of Systems   Review of Systems  Cardiovascular:  Positive for chest pain.  All other systems reviewed and are negative.   Physical Exam Updated Vital Signs BP (!) 150/92 (BP Location: Right Arm)   Pulse 85   Temp 98.6 F (37 C) (Oral)   Resp 15   Ht 6\' 1"  (1.854 m)   Wt 108.9 kg   SpO2 100%   BMI 31.66 kg/m  Physical Exam Vitals and nursing note reviewed.  Constitutional:      General: She is not in acute distress.    Appearance: She is well-developed.  HENT:     Head: Normocephalic and atraumatic.  Eyes:     Conjunctiva/sclera: Conjunctivae normal.  Cardiovascular:     Rate and Rhythm: Normal rate and regular rhythm.     Heart sounds: No murmur heard. Pulmonary:     Effort: Pulmonary effort is normal. No respiratory distress.     Breath sounds: Normal breath sounds. No decreased breath sounds, wheezing, rhonchi or rales.  Abdominal:     Palpations: Abdomen is soft.     Tenderness: There is no abdominal tenderness.  Musculoskeletal:        General: No swelling.     Cervical back: Neck supple.  Skin:    General:  Skin is warm and dry.     Capillary Refill: Capillary refill takes less than 2 seconds.  Neurological:     Mental Status: She is alert.  Psychiatric:        Mood and Affect: Mood normal.     ED Results / Procedures / Treatments   Labs (all labs ordered are listed, but only abnormal results are displayed) Labs Reviewed  CBC - Abnormal; Notable for the following components:      Result Value   WBC 13.8 (*)    Hemoglobin 11.8 (*)    MCH 25.1 (*)    RDW 17.3 (*)    All other components within normal limits  COMPREHENSIVE METABOLIC PANEL - Abnormal; Notable for the following components:   Potassium 3.4 (*)    Creatinine, Ser 1.13 (*)    Calcium 8.8 (*)    Albumin 3.4 (*)    All other  components within normal limits  D-DIMER, QUANTITATIVE - Abnormal; Notable for the following components:   D-Dimer, Quant 0.73 (*)    All other components within normal limits  HCG, SERUM, QUALITATIVE  TROPONIN I (HIGH SENSITIVITY)  TROPONIN I (HIGH SENSITIVITY)    EKG EKG Interpretation Date/Time:  Tuesday April 06 2023 16:03:40 EST Ventricular Rate:  101 PR Interval:  124 QRS Duration:  84 QT Interval:  370 QTC Calculation: 479 R Axis:   -10  Text Interpretation: Sinus tachycardia with frequent Premature ventricular complexes in a pattern of bigeminy Possible Anterior infarct , age undetermined Abnormal ECG When compared with ECG of 30-Sep-2022 19:01, PREVIOUS ECG IS PRESENT New since previous tracing Confirmed by Vanetta Mulders [phone]) on 04/06/2023 5:52:29 PM  Radiology CT Angio Chest PE W and/or Wo Contrast Result Date: 04/06/2023 CLINICAL DATA:  Chest pain short of breath EXAM: CT ANGIOGRAPHY CHEST WITH CONTRAST TECHNIQUE: Multidetector CT imaging of the chest was performed using the standard protocol during bolus administration of intravenous contrast. Multiplanar CT image reconstructions and MIPs were obtained to evaluate the vascular anatomy. RADIATION DOSE REDUCTION: This exam was performed according to the departmental dose-optimization program which includes automated exposure control, adjustment of the mA and/or kV according to patient size and/or use of iterative reconstruction technique. CONTRAST:  75mL OMNIPAQUE IOHEXOL 350 MG/ML SOLN COMPARISON:  Chest x-ray 04/06/2023, CT chest 05/24/2021 FINDINGS: Cardiovascular: Satisfactory opacification of the pulmonary arteries to the segmental level. No evidence of pulmonary embolism. Mild cardiomegaly. No pericardial effusion. Nonaneurysmal aorta. Mediastinum/Nodes: No enlarged mediastinal, hilar, or axillary lymph nodes. Thyroid gland, trachea, and esophagus demonstrate no significant findings. Lungs/Pleura: Lungs are clear. No  pleural effusion or pneumothorax. Upper Abdomen: No acute abnormality. Musculoskeletal: Scoliosis. Right-sided rib anomalies. No acute osseous abnormality. Review of the MIP images confirms the above findings. IMPRESSION: Negative for acute pulmonary embolus or aortic dissection. Clear lung fields. Mild cardiomegaly. Electronically Signed   By: Jasmine Pang M.D.   On: 04/06/2023 23:00   DG Chest 2 View Result Date: 04/06/2023 CLINICAL DATA:  Chest pain EXAM: CHEST - 2 VIEW COMPARISON:  09/30/2022 FINDINGS: No acute airspace disease or effusion. Normal cardiac size. No pneumothorax. Scoliosis of the spine IMPRESSION: No active cardiopulmonary disease. Electronically Signed   By: Jasmine Pang M.D.   On: 04/06/2023 18:43    Procedures Procedures   Medications Ordered in ED Medications  iohexol (OMNIPAQUE) 350 MG/ML injection 75 mL (75 mLs Intravenous Contrast Given 04/06/23 2235)  oxyCODONE-acetaminophen (PERCOCET/ROXICET) 5-325 MG per tablet 1 tablet (1 tablet Oral Given 04/07/23 0450)  benzonatate (TESSALON)  capsule 200 mg (200 mg Oral Given 04/07/23 0450)    ED Course/ Medical Decision Making/ A&P                               Medical Decision Making Amount and/or Complexity of Data Reviewed Labs: ordered. Radiology: ordered.  Risk Prescription drug management.   This patient presents to the ED for concern of chest pain. Differential diagnosis includes ACS, PE, pneumonia, bronchitis, viral URI   Lab Tests:  I Ordered, and personally interpreted labs.  The pertinent results include: CBC with leukocytosis at 13.8, CMP largely markable with mild hypokalemia seen, troponin at 9 with delta stable at 9, serum hCG negative, D-dimer elevated at 0.73   Imaging Studies ordered:  I ordered imaging studies including chest x-ray, CT angio chest I independently visualized and interpreted imaging which showed no acute cardiopulmonary process seen, no evidence of PE I agree with the  radiologist interpretation   Medicines ordered and prescription drug management:  I ordered medication including Percocet, Tessalon for pain, cough Reevaluation of the patient after these medicines showed that the patient improved I have reviewed the patients home medicines and have made adjustments as needed   Problem List / ED Course:  Patient without significant past medical history presents the emergency department concerns of chest pain.  Patient endorses that she has been feeling right-sided chest pain with radiation to the right arm starting earlier today.  Denies any nausea, vomiting or shortness of breath or exertional dyspnea.  No prior cardiac history.  Not on blood thinners.  Does endorse some recent substance use although is not forthcoming with what the substances may be. On exam, patient appears to be in no acute distress.  Labs obtained from triage for assessment of symptoms.  Patient had an elevated D-dimer level which did not trigger CT angio chest to be ordered after unremarkable chest x-ray.  Troponin levels unremarkable, mild leukocytosis at 13.8.  Suspect this could be secondary to viral process. CT angio chest negative for PE. Troponin initially at 9 with no change seen in repeat troponin. Dose of Percocet given for pain control with improvement in symptoms. Given reassuring workup, no acute indication for admission at this time. Did discuss potential for outpatient cardiology follow up if symptoms are not improving. Discussed strict return precautions. Discharged home in stable condition.  Final Clinical Impression(s) / ED Diagnoses Final diagnoses:  Nonspecific chest pain    Rx / DC Orders ED Discharge Orders          Ordered    benzonatate (TESSALON) 100 MG capsule  Every 8 hours        04/07/23 0502              Smitty Knudsen, PA-C 04/07/23 0657    Mesner, Barbara Cower, MD 04/08/23 [phone]

## 2023-04-07 NOTE — Discharge Instructions (Addendum)
You were seen in the ER today with concerns of chest pain. Your labs, imaging, and EKG were reassuring. I am not sure what is causing this chest discomfort, but it could be due to the coughing you have been experiencing. I would avoid the use of any illicit substances as this can cause worsening chest discomfort. If symptoms are not improving, I would highly recommend following up with your primary care provider and cardiology. If symptoms worsen, return to the ER.

## 2023-08-01 ENCOUNTER — Ambulatory Visit: Admission: EM | Admit: 2023-08-01 | Discharge: 2023-08-01 | Disposition: A | Discharge status: Home / Self Care

## 2023-08-01 DIAGNOSIS — M4180 Other forms of scoliosis, site unspecified: Secondary | ICD-10-CM | POA: Insufficient documentation

## 2023-08-01 DIAGNOSIS — J301 Allergic rhinitis due to pollen: Secondary | ICD-10-CM | POA: Diagnosis not present

## 2023-08-01 DIAGNOSIS — J019 Acute sinusitis, unspecified: Secondary | ICD-10-CM | POA: Diagnosis not present

## 2023-08-01 DIAGNOSIS — M79604 Pain in right leg: Secondary | ICD-10-CM | POA: Insufficient documentation

## 2023-08-01 DIAGNOSIS — G8929 Other chronic pain: Secondary | ICD-10-CM | POA: Insufficient documentation

## 2023-08-01 MED ORDER — PREDNISONE 20 MG PO TABS
40.0000 mg | ORAL_TABLET | Freq: Every day | ORAL | 0 refills | Status: AC
Start: 2023-08-01 — End: 2023-08-06

## 2023-08-01 MED ORDER — AMOXICILLIN-POT CLAVULANATE 875-125 MG PO TABS: 1.0000 | ORAL_TABLET | Freq: Two times a day (BID) | ORAL | 0 refills | Status: DC

## 2023-08-01 MED ORDER — CETIRIZINE HCL 10 MG PO TABS: 10.0000 mg | ORAL_TABLET | Freq: Every day | ORAL | 0 refills | Status: AC

## 2023-08-01 NOTE — ED Provider Notes (Signed)
 [facility]    CSN: [id number] Arrival date & time: 08/01/23  1322      History   Chief Complaint Chief Complaint  Patient presents with   Allergies    HPI Suzanne Morrison is a 43 y.o. female.   Patient here today for evaluation of sinus congestion and pressure that she thinks is related to allergy flare.  She notes symptoms started 3 to weeks ago but worsened recently.  She has had runny nose watery eyes and significant congestion.  She reports mild cough at times.  She has not had fever.  She denies taking any medication for allergies.  The history is provided by the patient.    Past Medical History:  Diagnosis Date   Collapse, lung    History of lung surgery    Pneumonia    Pneumothorax    Right    Patient Active Problem List   Diagnosis Date Noted   Back pain, chronic 08/01/2023   Levoscoliosis 08/01/2023   Right leg pain 08/01/2023   Closed displaced trimalleolar fracture of left lower leg 01/25/2018   Syndesmotic disruption of left ankle 01/25/2018   Essential hypertension 11/04/2015   Right-sided chest wall pain 11/04/2015   Anemia 07/08/2015   Cigarette nicotine dependence without complication 07/08/2015   Generalized anxiety disorder 07/08/2015   History of acute respiratory failure 07/08/2015   Obesity (BMI 30-39.9) 07/08/2015   History of pneumothorax 07/08/2015    Past Surgical History:  Procedure Laterality Date   CHEST TUBE INSERTION      OB History   No obstetric history on file.      Home Medications    Prior to Admission medications   Medication Sig Start Date End Date Taking? Authorizing Provider  amoxicillin-clavulanate (AUGMENTIN) 875-125 MG tablet Take 1 tablet by mouth every 12 (twelve) hours. 08/01/23  Yes Vernestine Gondola, PA-C  baclofen (LIORESAL) 10 MG tablet Take 10 mg by mouth 3 (three) times daily. 09/24/21  Yes [provider]  benzocaine (ORAJEL) 10 % mucosal gel Use as directed 1 Application in the  mouth or throat 3 (three) times daily as needed. 08/24/22 08/24/23 Yes [provider]  cetirizine (ZYRTEC) 10 MG tablet Take 1 tablet (10 mg total) by mouth daily. 08/01/23  Yes Vernestine Gondola, PA-C  ibuprofen (ADVIL) 800 MG tablet Take 800 mg by mouth every 6 (six) hours as needed. 05/19/21  Yes [provider]  pantoprazole (PROTONIX) 40 MG tablet Take 40 mg by mouth daily. 01/22/22  Yes [provider]  penicillin v potassium (VEETID) 500 MG tablet Take 500 mg by mouth 4 (four) times daily. 11/13/21  Yes [provider]  predniSONE (DELTASONE) 20 MG tablet Take 2 tablets (40 mg total) by mouth daily with breakfast for 5 days. 08/01/23 08/06/23 Yes Vernestine Gondola, PA-C  sucralfate (CARAFATE) 1 g tablet Take 1 g by mouth 4 (four) times daily. 01/22/22  Yes [provider]  albuterol (VENTOLIN HFA) 108 (90 Base) MCG/ACT inhaler Inhale 2 puffs into the lungs every 6 (six) hours as needed for wheezing or shortness of breath. 09/26/20   Aberman, Caroline C, PA-C  benzonatate (TESSALON) 100 MG capsule Take 1 capsule (100 mg total) by mouth every 8 (eight) hours. 04/07/23   Zelaya, Oscar A, PA-C  cyclobenzaprine (FLEXERIL) 10 MG tablet Take 1 tablet (10 mg total) by mouth 2 (two) times daily as needed for muscle spasms. 09/30/22   Almond Army, MD  fluticasone (FLONASE) 50 MCG/ACT  nasal spray Place 1 spray into both nostrils daily. 09/30/22   Almond Army, MD  levofloxacin (LEVAQUIN) 500 MG tablet Take 1 tablet (500 mg total) by mouth daily. 05/24/21   Burnette Carte, MD  naproxen (NAPROSYN) 500 MG tablet Take 1 tablet (500 mg total) by mouth 2 (two) times daily. 12/31/22   Henderly, Britni A, PA-C  oxyCODONE (ROXICODONE) 5 MG immediate release tablet Take 1 tablet (5 mg total) by mouth every 4 (four) hours as needed for severe pain. 12/31/22   Henderly, Britni A, PA-C    Family History Family History  Problem Relation Age of Onset   Diabetes Mother     Healthy Father     Social History Social History   Tobacco Use   Smoking status: Every Day    Current packs/day: 0.50    Types: Cigarettes   Smokeless tobacco: Never  Vaping Use   Vaping status: Never Used  Substance Use Topics   Alcohol use: Yes    Comment: weekends   Drug use: Not Currently    Types: Cocaine     Allergies   Propoxyphene   Review of Systems Review of Systems  Constitutional:  Negative for chills and fever.  HENT:  Positive for congestion and sinus pressure. Negative for ear pain and sore throat.   Eyes:  Negative for discharge and redness.  Respiratory:  Positive for cough. Negative for shortness of breath and wheezing.   Gastrointestinal:  Negative for abdominal pain, diarrhea, nausea and vomiting.     Physical Exam Triage Vital Signs ED Triage Vitals  Encounter Vitals Group     BP 08/01/23 1336 125/82     Systolic BP Percentile --      Diastolic BP Percentile --      Pulse Rate 08/01/23 1336 80     Resp 08/01/23 1336 18     Temp 08/01/23 1336 98.3 F (36.8 C)     Temp Source 08/01/23 1336 Oral     SpO2 08/01/23 1336 98 %     Weight 08/01/23 1335 250 lb (113.4 kg)     Height 08/01/23 1335 6\' 1"  (1.854 m)     Head Circumference --      Peak Flow --      Pain Score 08/01/23 1333 0     Pain Loc --      Pain Education --      Exclude from Growth Chart --    No data found.  Updated Vital Signs BP 125/82 (BP Location: Left Arm)   Pulse 80   Temp 98.3 F (36.8 C) (Oral)   Resp 18   Ht 6\' 1"  (1.854 m)   Wt 250 lb (113.4 kg)   LMP 07/02/2023 (Exact Date)   SpO2 98%   BMI 32.98 kg/m   Visual Acuity Right Eye Distance:   Left Eye Distance:   Bilateral Distance:    Right Eye Near:   Left Eye Near:    Bilateral Near:     Physical Exam Vitals and nursing note reviewed.  Constitutional:      General: She is not in acute distress.    Appearance: Normal appearance. She is not ill-appearing.  HENT:     Head: Normocephalic and  atraumatic.     Nose: Congestion present.     Mouth/Throat:     Mouth: Mucous membranes are moist.     Pharynx: No oropharyngeal exudate or posterior oropharyngeal erythema.  Eyes:     Conjunctiva/sclera: Conjunctivae normal.  Cardiovascular:     Rate and Rhythm: Normal rate and regular rhythm.     Heart sounds: Normal heart sounds. No murmur heard. Pulmonary:     Effort: Pulmonary effort is normal. No respiratory distress.     Breath sounds: Normal breath sounds. No wheezing, rhonchi or rales.  Skin:    General: Skin is warm and dry.  Neurological:     Mental Status: She is alert.  Psychiatric:        Mood and Affect: Mood normal.        Thought Content: Thought content normal.      UC Treatments / Results  Labs (all labs ordered are listed, but only abnormal results are displayed) Labs Reviewed - No data to display  EKG   Radiology No results found.  Procedures Procedures (including critical care time)  Medications Ordered in UC Medications - No data to display  Initial Impression / Assessment and Plan / UC Course  I have reviewed the triage vital signs and the nursing notes.  Pertinent labs & imaging results that were available during my care of the patient were reviewed by me and considered in my medical decision making (see chart for details).    Will treat to cover suspected sinusitis with Augmentin.  Steroid burst and Zyrtec prescribed for allergy treatment.  Encouraged follow-up if no gradual improvement or with any worsening.  Final Clinical Impressions(s) / UC Diagnoses   Final diagnoses:  Acute non-recurrent sinusitis, unspecified location  Seasonal allergic rhinitis due to pollen   Discharge Instructions   None    ED Prescriptions     Medication Sig Dispense Auth. Provider   predniSONE (DELTASONE) 20 MG tablet Take 2 tablets (40 mg total) by mouth daily with breakfast for 5 days. 10 tablet Vernestine Gondola, PA-C   amoxicillin-clavulanate  (AUGMENTIN) 875-125 MG tablet Take 1 tablet by mouth every 12 (twelve) hours. 14 tablet Jami Mcclintock F, PA-C   cetirizine (ZYRTEC) 10 MG tablet Take 1 tablet (10 mg total) by mouth daily. 30 tablet Vernestine Gondola, PA-C      PDMP not reviewed this encounter.   Vernestine Gondola, PA-C 08/01/23 [phone]

## 2023-08-01 NOTE — ED Triage Notes (Signed)
"  This started with Allergy flare up (with all the pollen) about 3 wks ago and still remains with nasal congestion, runny nose, eyes watery".

## 2023-10-04 ENCOUNTER — Encounter (HOSPITAL_COMMUNITY): Payer: Self-pay

## 2023-10-04 ENCOUNTER — Emergency Department (HOSPITAL_COMMUNITY)
Admission: EM | Admit: 2023-10-04 | Discharge: 2023-10-04 | Discharge status: Against Medical Advice | Attending: Emergency Medicine | Admitting: Emergency Medicine

## 2023-10-04 ENCOUNTER — Emergency Department (HOSPITAL_COMMUNITY)

## 2023-10-04 ENCOUNTER — Other Ambulatory Visit: Payer: Self-pay

## 2023-10-04 DIAGNOSIS — R1031 Right lower quadrant pain: Secondary | ICD-10-CM | POA: Diagnosis present

## 2023-10-04 LAB — CBC
HCT: 36.4 % (ref 36.0–46.0)
Hemoglobin: 11 g/dL — ABNORMAL LOW (ref 12.0–15.0)
MCH: 24.6 pg — ABNORMAL LOW (ref 26.0–34.0)
MCHC: 30.2 g/dL (ref 30.0–36.0)
MCV: 81.3 fL (ref 80.0–100.0)
Platelets: 364 10*3/uL (ref 150–400)
RBC: 4.48 MIL/uL (ref 3.87–5.11)
RDW: 17.2 % — ABNORMAL HIGH (ref 11.5–15.5)
WBC: 12.5 10*3/uL — ABNORMAL HIGH (ref 4.0–10.5)
nRBC: 0 % (ref 0.0–0.2)

## 2023-10-04 LAB — URINALYSIS, ROUTINE W REFLEX MICROSCOPIC
Bilirubin Urine: NEGATIVE
Glucose, UA: NEGATIVE mg/dL
Hgb urine dipstick: NEGATIVE
Ketones, ur: NEGATIVE mg/dL
Leukocytes,Ua: NEGATIVE
Nitrite: NEGATIVE
Protein, ur: NEGATIVE mg/dL
Specific Gravity, Urine: 1.024 (ref 1.005–1.030)
pH: 6 (ref 5.0–8.0)

## 2023-10-04 LAB — LIPASE, BLOOD: Lipase: 27 U/L (ref 11–51)

## 2023-10-04 LAB — COMPREHENSIVE METABOLIC PANEL WITH GFR
ALT: 13 U/L (ref 0–44)
AST: 15 U/L (ref 15–41)
Albumin: 3.6 g/dL (ref 3.5–5.0)
Alkaline Phosphatase: 64 U/L (ref 38–126)
Anion gap: 9 (ref 5–15)
BUN: 12 mg/dL (ref 6–20)
CO2: 24 mmol/L (ref 22–32)
Calcium: 8.6 mg/dL — ABNORMAL LOW (ref 8.9–10.3)
Chloride: 102 mmol/L (ref 98–111)
Creatinine, Ser: 0.8 mg/dL (ref 0.44–1.00)
GFR, Estimated: >60 mL/min (ref >60)
Glucose, Bld: 93 mg/dL (ref 70–99)
Potassium: 3.4 mmol/L — ABNORMAL LOW (ref 3.5–5.1)
Sodium: 135 mmol/L (ref 135–145)
Total Bilirubin: 0.4 mg/dL (ref 0.0–1.2)
Total Protein: 7.2 g/dL (ref 6.5–8.1)

## 2023-10-04 LAB — HCG, SERUM, QUALITATIVE: Preg, Serum: NEGATIVE

## 2023-10-04 MED ORDER — IOHEXOL 300 MG/ML  SOLN: 100.0000 mL | Freq: Once | INTRAMUSCULAR | Status: DC | PRN

## 2023-10-04 MED ORDER — MORPHINE SULFATE (PF) 4 MG/ML IV SOLN
4.0000 mg | Freq: Once | INTRAVENOUS | Status: AC
  Administered 2023-10-04: 4 mg via INTRAVENOUS
  Filled 2023-10-04: qty 1

## 2023-10-04 NOTE — ED Provider Notes (Signed)
 [place] EMERGENCY DEPARTMENT AT [facility] Provider Note   CSN: [id number] Arrival date & time: 10/04/23  1615     History  Chief Complaint  Patient presents with  . Abdominal Pain    Suzanne Morrison is a 43 y.o. female with PMH as listed below who presents with right sided abdominal pain x 2 weeks that is worsening. She had a very heavy period and a lot of abdominal cramping during her period 3 weeks ago. No longer bleeding. No acute vaginal sxs, urinary sxs. Had some nausea with no vomiting. Denies f/c. No ameliorating/aggravating factors to pain. G3P3.  Had 3 C-sections and no other abdominal surgical history.  Had been told in the past that she had a right sided ovarian cyst.  Past Medical History:  Diagnosis Date  . Collapse, lung   . History of lung surgery   . Pneumonia   . Pneumothorax    Right       Home Medications Prior to Admission medications   Medication Sig Start Date End Date Taking? Authorizing Provider  albuterol  (VENTOLIN  HFA) 108 (90 Base) MCG/ACT inhaler Inhale 2 puffs into the lungs every 6 (six) hours as needed for wheezing or shortness of breath. 09/26/20   Aberman, Caroline C, PA-C  amoxicillin -clavulanate (AUGMENTIN ) 875-125 MG tablet Take 1 tablet by mouth every 12 (twelve) hours. 08/01/23   Vernestine Gondola, PA-C  baclofen (LIORESAL) 10 MG tablet Take 10 mg by mouth 3 (three) times daily. 09/24/21   [provider]  benzonatate  (TESSALON ) 100 MG capsule Take 1 capsule (100 mg total) by mouth every 8 (eight) hours. 04/07/23   Zelaya, Oscar A, PA-C  cetirizine  (ZYRTEC ) 10 MG tablet Take 1 tablet (10 mg total) by mouth daily. 08/01/23   Vernestine Gondola, PA-C  cyclobenzaprine  (FLEXERIL ) 10 MG tablet Take 1 tablet (10 mg total) by mouth 2 (two) times daily as needed for muscle spasms. 09/30/22   Almond Army, MD  fluticasone  (FLONASE ) 50 MCG/ACT nasal spray Place 1 spray into both nostrils daily. 09/30/22   Almond Army, MD   ibuprofen  (ADVIL ) 800 MG tablet Take 800 mg by mouth every 6 (six) hours as needed. 05/19/21   [provider]  levofloxacin  (LEVAQUIN ) 500 MG tablet Take 1 tablet (500 mg total) by mouth daily. 05/24/21   Burnette Carte, MD  naproxen  (NAPROSYN ) 500 MG tablet Take 1 tablet (500 mg total) by mouth 2 (two) times daily. 12/31/22   Henderly, Britni A, PA-C  oxyCODONE  (ROXICODONE ) 5 MG immediate release tablet Take 1 tablet (5 mg total) by mouth every 4 (four) hours as needed for severe pain. 12/31/22   Henderly, Britni A, PA-C  pantoprazole (PROTONIX) 40 MG tablet Take 40 mg by mouth daily. 01/22/22   [provider]  penicillin v potassium (VEETID) 500 MG tablet Take 500 mg by mouth 4 (four) times daily. 11/13/21   [provider]  sucralfate (CARAFATE) 1 g tablet Take 1 g by mouth 4 (four) times daily. 01/22/22   [provider]      Allergies    Propoxyphene    Review of Systems   Review of Systems A 10 point review of systems was performed and is negative unless otherwise reported in HPI.  Physical Exam Updated Vital Signs BP (!) 132/98   Pulse 96   Temp 98.7 F (37.1 C) (Oral)   Resp 16   Ht 6' 1 (1.854 m)   Wt 113 kg   SpO2 100%  BMI 32.87 kg/m  Physical Exam General: Normal appearing female, lying in bed.  HEENT: PERRLA, Sclera anicteric, MMM, trachea midline.  Cardiology: RRR, no murmurs/rubs/gallops Resp: Normal respiratory rate and effort. CTAB, no wheezes, rhonchi, crackles.  Abd: Soft, +RLQ abd mild TTP, non-distended. No rebound tenderness or guarding.  GU: Deferred. MSK: No peripheral edema or signs of trauma. Extremities without deformity or TTP. No cyanosis or clubbing. Skin: warm, dry Back: No CVA tenderness Neuro: A&Ox4, CNs II-XII grossly intact. MAEs. Sensation grossly intact.  Psych: Normal mood and affect.   ED Results / Procedures / Treatments   Labs (all labs ordered are listed, but only abnormal results are  displayed) Labs Reviewed  COMPREHENSIVE METABOLIC PANEL WITH GFR - Abnormal; Notable for the following components:      Result Value   Potassium 3.4 (*)    Calcium 8.6 (*)    All other components within normal limits  CBC - Abnormal; Notable for the following components:   WBC 12.5 (*)    Hemoglobin 11.0 (*)    MCH 24.6 (*)    RDW 17.2 (*)    All other components within normal limits  LIPASE, BLOOD  URINALYSIS, ROUTINE W REFLEX MICROSCOPIC  HCG, SERUM, QUALITATIVE    EKG None  Radiology No results found.  Procedures Procedures    Medications Ordered in ED Medications  iohexol  (OMNIPAQUE ) 300 MG/ML solution 100 mL (has no administration in time range)  morphine  (PF) 4 MG/ML injection 4 mg (4 mg Intravenous Given 10/04/23 1929)    ED Course/ Medical Decision Making/ A&P                          Medical Decision Making Amount and/or Complexity of Data Reviewed Labs: ordered. Decision-making details documented in ED Course. Radiology: ordered.  Risk Prescription drug management.    This patient presents to the ED for concern of right sided abdominal pain, this involves an extensive number of treatment options, and is a complaint that carries with it a high risk of complications and morbidity.  I considered the following differential and admission for this acute, potentially life threatening condition.  Patient is overall very well-appearing and hemodynamically stable.  MDM:    Patient with abdominal pain in the right lower quadrant.  Pregnancy test is negative, no concern for ectopic pregnancy or miscarriage.  Patient with negative UA for any UTI.  No blood in the urine, lower concern for ureterolithiasis.  Patient could have a hemorrhagic ovarian cyst, lower concern for ovarian torsion given timeframe of pain but if she has a large cyst on that ovary that is still a possibility.  Also consider other intra-abdominal causes such as appendicitis or diverticulitis.  I  recommend that patient have a CT scan but she states that she needs to leave to catch the bus and that she can come back in the morning when she is off of work.  I advised patient that she could have worsening of her condition up to and including death if she leaves AGAINST MEDICAL ADVICE but patient states she would like to leave the hospital.  Clinical Course as of 10/04/23 2047  Mon Oct 04, 2023  1904 Urinalysis, Routine w reflex microscopic -Urine, Clean Catch neg [HN]  1904 WBC(!): 12.5 +leukocytosis [HN]  1904 Comprehensive metabolic panel(!) Unremarkable in the context of this patient's presentation  [HN]  1930 Preg, Serum: NEGATIVE [HN]  2045 Urinalysis, Routine w reflex microscopic -Urine, Clean Catch  neg [HN]    Clinical Course User Index [HN] Merdis Stalling, MD    Labs: I Ordered, and personally interpreted labs.  The pertinent results include:  those listed above  Additional history obtained from chart review  Reevaluation: After the interventions noted above, I reevaluated the patient and found that they have :stayed the same  Social Determinants of Health: .lives independently  Disposition:  AMA  Co morbidities that complicate the patient evaluation . Past Medical History:  Diagnosis Date  . Collapse, lung   . History of lung surgery   . Pneumonia   . Pneumothorax    Right     Medicines Meds ordered this encounter  Medications  . morphine  (PF) 4 MG/ML injection 4 mg  . iohexol  (OMNIPAQUE ) 300 MG/ML solution 100 mL    I have reviewed the patients home medicines and have made adjustments as needed  Problem List / ED Course: Problem List Items Addressed This Visit   None Visit Diagnoses       RLQ abdominal pain    -  Primary                    This note was created using dictation software, which may contain spelling or grammatical errors.    Merdis Stalling, MD 10/04/23 [phone]

## 2023-10-04 NOTE — ED Triage Notes (Signed)
 Patient has right lower abdominal pain for 2 weeks. Has bad period cramps. Tried over the counter tylenol , ibuprofen , aleve . No nausea or vomiting.

## 2023-10-04 NOTE — ED Provider Triage Note (Cosign Needed)
 Emergency Medicine Provider Triage Evaluation Note  Suzanne Morrison , a 43 y.o. female  was evaluated in triage.  Pt complains of RLQ abdominal pain. Pain started roughly 3 weeks ago and has been cyclical in nature coming and going. Patient does endorse some nausea, but no vomiting. Denies fever/chills, no previous abdominal surgeries.   Review of Systems  Positive: RLQ abdominal pain, nausea Negative: Chest pain, shortness of breath, urinary symptoms, diarrhea  Physical Exam  BP (!) 132/98   Pulse 96   Temp 98.7 F (37.1 C) (Oral)   Resp 16   Ht 6' 1 (1.854 m)   Wt 113 kg   SpO2 100%   BMI 32.87 kg/m  Gen:   Awake, no distress   Resp:  Normal effort MSK:   Moves extremities without difficulty  Other:  Minimal tenderness to palpation of RLQ of abdomen, no rebound or guarding present, no CVA tenderness  Medical Decision Making  Medically screening exam initiated at 5:23 PM.  Appropriate orders placed.  Suzanne Morrison was informed that the remainder of the evaluation will be completed by another provider, this initial triage assessment does not replace that evaluation, and the importance of remaining in the ED until their evaluation is complete.  Triage work-up includes: CBC, CMP, lipase, serum HCG, UA   Suzanne Hymen, PA-C 10/04/23 1737

## 2023-10-04 NOTE — Discharge Instructions (Signed)
 Thank you for coming to [facility] Emergency Department. You were seen for abdominal pain. Your lab workup showed elevated white blood cell count. You chose to leave AGAINST MEDICAL ADVICE but stated that you could come back to the emergency department in the morning.   Please understand that you are leaving AGAINST MEDICAL ADVICE, and are excepting risks of leaving, including worsening of your condition up to and including death.  Please return to the emergency department for a CT scan.

## 2024-03-31 ENCOUNTER — Emergency Department (HOSPITAL_COMMUNITY): Admission: EM | Admit: 2024-03-31 | Discharge: 2024-03-31 | Disposition: A | Discharge status: Home / Self Care

## 2024-03-31 DIAGNOSIS — I10 Essential (primary) hypertension: Secondary | ICD-10-CM | POA: Insufficient documentation

## 2024-03-31 DIAGNOSIS — K0889 Other specified disorders of teeth and supporting structures: Secondary | ICD-10-CM | POA: Insufficient documentation

## 2024-03-31 MED ORDER — TRAMADOL HCL 50 MG PO TABS
50.0000 mg | ORAL_TABLET | Freq: Once | ORAL | Status: AC
  Administered 2024-03-31: 50 mg via ORAL
  Filled 2024-03-31: qty 1

## 2024-03-31 NOTE — ED Provider Notes (Signed)
  EMERGENCY DEPARTMENT AT [facility] Provider Note   CSN: [id number] Arrival date & time: 03/31/24  1218     Patient presents with: Headache and Dental Pain   Suzanne Morrison is a 43 y.o. female with past medical history of hypertension, anxiety, chronic back pain who presents emergency department for evaluation of left sided tooth pain.  Patient reports that her tooth on the upper left side has been bothering her for the last couple of months, but it has been worsening in intensity over the last few days.  Patient also reports a left-sided headache that she describes as heaviness that she believes is related to her tooth pain.  She denies any visual changes or photophobia.  At this time, she is not reporting any headache.  Her primary complaint is the pain in her tooth and request for a referral to a dentist.    Headache Dental Pain Associated symptoms: headaches        Prior to Admission medications  Medication Sig Start Date End Date Taking? Authorizing Provider  albuterol  (VENTOLIN  HFA) 108 (90 Base) MCG/ACT inhaler Inhale 2 puffs into the lungs every 6 (six) hours as needed for wheezing or shortness of breath. 09/26/20   Aberman, Caroline C, PA-C  amoxicillin -clavulanate (AUGMENTIN ) 875-125 MG tablet Take 1 tablet by mouth every 12 (twelve) hours. 08/01/23   Billy Asberry FALCON, PA-C  baclofen (LIORESAL) 10 MG tablet Take 10 mg by mouth 3 (three) times daily. 09/24/21   [provider]  benzonatate  (TESSALON ) 100 MG capsule Take 1 capsule (100 mg total) by mouth every 8 (eight) hours. 04/07/23   Zelaya, Oscar A, PA-C  cetirizine  (ZYRTEC ) 10 MG tablet Take 1 tablet (10 mg total) by mouth daily. 08/01/23   Billy Asberry FALCON, PA-C  cyclobenzaprine  (FLEXERIL ) 10 MG tablet Take 1 tablet (10 mg total) by mouth 2 (two) times daily as needed for muscle spasms. 09/30/22   Doretha Folks, MD  fluticasone  (FLONASE ) 50 MCG/ACT nasal spray Place 1 spray into both  nostrils daily. 09/30/22   Doretha Folks, MD  ibuprofen  (ADVIL ) 800 MG tablet Take 800 mg by mouth every 6 (six) hours as needed. 05/19/21   [provider]  levofloxacin  (LEVAQUIN ) 500 MG tablet Take 1 tablet (500 mg total) by mouth daily. 05/24/21   Laurice Maude BROCKS, MD  naproxen  (NAPROSYN ) 500 MG tablet Take 1 tablet (500 mg total) by mouth 2 (two) times daily. 12/31/22   Henderly, Britni A, PA-C  oxyCODONE  (ROXICODONE ) 5 MG immediate release tablet Take 1 tablet (5 mg total) by mouth every 4 (four) hours as needed for severe pain. 12/31/22   Henderly, Britni A, PA-C  pantoprazole (PROTONIX) 40 MG tablet Take 40 mg by mouth daily. 01/22/22   [provider]  penicillin v potassium (VEETID) 500 MG tablet Take 500 mg by mouth 4 (four) times daily. 11/13/21   [provider]  sucralfate (CARAFATE) 1 g tablet Take 1 g by mouth 4 (four) times daily. 01/22/22   [provider]    Allergies: Propoxyphene    Review of Systems  HENT:  Positive for dental problem.   Neurological:  Positive for headaches.    Updated Vital Signs BP (!) 147/90 (BP Location: Left Arm)   Pulse 99   Temp 98.1 F (36.7 C) (Oral)   Resp 16   Ht 5' 9 (1.753 m)   Wt 105.6 kg   SpO2 100%   BMI 34.39 kg/m   Physical Exam Vitals  and nursing note reviewed.  Constitutional:      Appearance: Normal appearance. She is not ill-appearing.  HENT:     Mouth/Throat:     Dentition: Abnormal dentition.      Comments: Patient with tenderness noted to the above area.  She is missing part of a tooth there.  No obvious abscess noted. Eyes:     General: No scleral icterus. Pulmonary:     Effort: Pulmonary effort is normal. No respiratory distress.  Musculoskeletal:        General: No deformity.  Skin:    Coloration: Skin is not jaundiced.  Neurological:     General: No focal deficit present.     Mental Status: She is alert.  Psychiatric:        Mood and Affect: Mood normal.     (all  labs ordered are listed, but only abnormal results are displayed) Labs Reviewed - No data to display  EKG: None  Radiology: No results found.  Procedures   Medications Ordered in the ED  traMADol  (ULTRAM ) tablet 50 mg (has no administration in time range)                                 Medical Decision Making  This patient presents to the ED for concern of tooth pain, this involves an extensive number of treatment options, and is a complaint that carries with it a high risk of complications and morbidity.   Differential diagnosis includes: Dental abscess, cellulitis, suppurative parotitis, Ludwig's angina  Co morbidities:  poor dentition, hypertension   Lab Tests:  Not indicated  Imaging Studies:  I did offer a CT scan of the patient's maxilla face, however she declined stating she would rather just have a referral to a dentist   Cardiac Monitoring/ECG:  The patient was maintained on a cardiac monitor.  I personally viewed and interpreted the cardiac monitored which showed an underlying rhythm of: Sinus rhythm  Medicines ordered and prescription drug management:  I ordered medication including  Medications  traMADol  (ULTRAM ) tablet 50 mg (has no administration in time range)   for pain control Reevaluation of the patient after these medicines showed that the patient improved I have reviewed the patients home medicines and have made adjustments as needed  Test Considered:   none  Critical Interventions:   none  Consultations Obtained: None  Problem List / ED Course:     ICD-10-CM   1. Pain, dental  K08.89       MDM: 43 year old female who presents to the emergency department for evaluation of left-sided dental pain.  Refer to patient's physical exam findings above.  There is no airway compromise, trismus, systemic symptoms to suggest a drainable abscess at this time.  I did offer the patient a CT of her max face for further evaluation, but she  declined requesting a referral to a dentist.  I did give her tramadol  for pain control prior to discharge.  Her vital signs are stable and patient is appropriate for discharge at this time.   Dispostion:  After consideration of the diagnostic results and the patients response to treatment, I feel that the patient would benefit from dental follow-up and pain control.   Final diagnoses:  Pain, dental    ED Discharge Orders     None          Torrence Marry RAMAN, PA-C 03/31/24 1408    Simon Rea  N, MD 03/31/24 1651

## 2024-03-31 NOTE — Discharge Instructions (Addendum)
 It was a pleasure taking care of you today. You were seen in the Emergency Department for evaluation of dental pain. Your work-up was reassuring.  I did offer you a CT scan, however you declined requesting a referral to the dentist.  I did treat your pain with a one-time dose of tramadol .  Refer to the attached documentation for further management of your symptoms.  I have provided you with a dental resource guide for dentist located throughout [place].  There are also some dentists that have weekend availability, which I suggest calling to see if you can get an appointment.  Please return to the ER if you experience chest pain, trouble breathing, intractable nausea/vomiting or any other life threatening illnesses.

## 2024-03-31 NOTE — ED Triage Notes (Signed)
 Patient in today reporting ongoing headaches x3 days with nausea and upper left sided tooth pain.

## 2024-04-03 ENCOUNTER — Other Ambulatory Visit: Payer: Self-pay

## 2024-04-03 ENCOUNTER — Emergency Department (HOSPITAL_COMMUNITY)
Admission: EM | Admit: 2024-04-03 | Discharge: 2024-04-03 | Discharge status: Against Medical Advice | Attending: Emergency Medicine | Admitting: Emergency Medicine

## 2024-04-03 ENCOUNTER — Ambulatory Visit
Admission: EM | Admit: 2024-04-03 | Discharge: 2024-04-03 | Disposition: A | Discharge status: Home / Self Care | Attending: Internal Medicine | Admitting: Internal Medicine

## 2024-04-03 DIAGNOSIS — Z5321 Procedure and treatment not carried out due to patient leaving prior to being seen by health care provider: Secondary | ICD-10-CM | POA: Diagnosis not present

## 2024-04-03 DIAGNOSIS — J0100 Acute maxillary sinusitis, unspecified: Secondary | ICD-10-CM

## 2024-04-03 DIAGNOSIS — M791 Myalgia, unspecified site: Secondary | ICD-10-CM | POA: Diagnosis not present

## 2024-04-03 DIAGNOSIS — R0981 Nasal congestion: Secondary | ICD-10-CM | POA: Diagnosis not present

## 2024-04-03 DIAGNOSIS — R059 Cough, unspecified: Secondary | ICD-10-CM | POA: Diagnosis present

## 2024-04-03 LAB — RESP PANEL BY RT-PCR (RSV, FLU A&B, COVID)  RVPGX2
Influenza A by PCR: NEGATIVE
Influenza B by PCR: NEGATIVE
Resp Syncytial Virus by PCR: NEGATIVE
SARS Coronavirus 2 by RT PCR: NEGATIVE

## 2024-04-03 MED ORDER — AMOXICILLIN-POT CLAVULANATE 875-125 MG PO TABS: 1.0000 | ORAL_TABLET | Freq: Two times a day (BID) | ORAL | 0 refills | Status: AC

## 2024-04-03 MED ORDER — BENZONATATE 100 MG PO CAPS: 100.0000 mg | ORAL_CAPSULE | Freq: Three times a day (TID) | ORAL | 0 refills | Status: AC

## 2024-04-03 MED ORDER — FLUTICASONE PROPIONATE 50 MCG/ACT NA SUSP: 2.0000 | Freq: Every day | NASAL | 2 refills | Status: AC

## 2024-04-03 NOTE — ED Provider Notes (Signed)
 [facility]    CSN: [id number] Arrival date & time: 04/03/24  1856      History   Chief Complaint Chief Complaint  Patient presents with   URI    HPI Suzanne Morrison is a 43 y.o. female.   43 year old female presents urgent care with complaints of sinus congestion, headache, cough, sore throat.  She reports her symptoms started a little over a week ago.  Her symptoms have gotten worse.  She denies any fevers or chills.  She was seen in the emergency room earlier today and had a respiratory panel done but left prior to being seen.  She denies any shortness of breath, vomiting, nausea, chest pain.  She has been using over-the-counter medication without much relief.   URI Presenting symptoms: congestion, cough and sore throat   Presenting symptoms: no ear pain and no fever   Associated symptoms: headaches   Associated symptoms: no arthralgias     Past Medical History:  Diagnosis Date   Collapse, lung    History of lung surgery    Pneumonia    Pneumothorax    Right    Patient Active Problem List   Diagnosis Date Noted   Back pain, chronic 08/01/2023   Levoscoliosis 08/01/2023   Right leg pain 08/01/2023   Closed displaced trimalleolar fracture of left lower leg 01/25/2018   Syndesmotic disruption of left ankle 01/25/2018   Essential hypertension 11/04/2015   Right-sided chest wall pain 11/04/2015   Anemia 07/08/2015   Cigarette nicotine dependence without complication 07/08/2015   Generalized anxiety disorder 07/08/2015   History of acute respiratory failure 07/08/2015   Obesity (BMI 30-39.9) 07/08/2015   History of pneumothorax 07/08/2015    Past Surgical History:  Procedure Laterality Date   CHEST TUBE INSERTION      OB History     Gravida  4   Para  3   Term  3   Preterm      AB  1   Living         SAB  1   IAB      Ectopic      Multiple      Live Births               Home Medications    Prior to Admission  medications  Medication Sig Start Date End Date Taking? Authorizing Provider  amoxicillin -clavulanate (AUGMENTIN ) 875-125 MG tablet Take 1 tablet by mouth every 12 (twelve) hours. 04/03/24  Yes Ary Rudnick A, PA-C  benzonatate  (TESSALON ) 100 MG capsule Take 1 capsule (100 mg total) by mouth every 8 (eight) hours. 04/03/24  Yes Jaretzy Lhommedieu A, PA-C  fluticasone  (FLONASE ) 50 MCG/ACT nasal spray Place 2 sprays into both nostrils daily. 04/03/24  Yes Nate Perri A, PA-C  albuterol  (VENTOLIN  HFA) 108 (90 Base) MCG/ACT inhaler Inhale 2 puffs into the lungs every 6 (six) hours as needed for wheezing or shortness of breath. 09/26/20   Aberman, Caroline C, PA-C  baclofen (LIORESAL) 10 MG tablet Take 10 mg by mouth 3 (three) times daily. 09/24/21   [provider]  cetirizine  (ZYRTEC ) 10 MG tablet Take 1 tablet (10 mg total) by mouth daily. 08/01/23   Billy Asberry FALCON, PA-C  cyclobenzaprine  (FLEXERIL ) 10 MG tablet Take 1 tablet (10 mg total) by mouth 2 (two) times daily as needed for muscle spasms. 09/30/22   Doretha Folks, MD  ibuprofen  (ADVIL ) 800 MG tablet Take 800 mg by mouth every 6 (six) hours as  needed. 05/19/21   [provider]  levofloxacin  (LEVAQUIN ) 500 MG tablet Take 1 tablet (500 mg total) by mouth daily. 05/24/21   Laurice Maude BROCKS, MD  naproxen  (NAPROSYN ) 500 MG tablet Take 1 tablet (500 mg total) by mouth 2 (two) times daily. 12/31/22   Henderly, Britni A, PA-C  oxyCODONE  (ROXICODONE ) 5 MG immediate release tablet Take 1 tablet (5 mg total) by mouth every 4 (four) hours as needed for severe pain. 12/31/22   Henderly, Britni A, PA-C  pantoprazole (PROTONIX) 40 MG tablet Take 40 mg by mouth daily. 01/22/22   [provider]  penicillin v potassium (VEETID) 500 MG tablet Take 500 mg by mouth 4 (four) times daily. 11/13/21   [provider]  sucralfate (CARAFATE) 1 g tablet Take 1 g by mouth 4 (four) times daily. 01/22/22   [provider]     Family History Family History  Problem Relation Age of Onset   Diabetes Mother    Healthy Father     Social History Social History[1]   Allergies   Propoxyphene   Review of Systems Review of Systems  Constitutional:  Negative for chills and fever.  HENT:  Positive for congestion and sore throat. Negative for ear pain.   Eyes:  Negative for pain and visual disturbance.  Respiratory:  Positive for cough. Negative for shortness of breath.   Cardiovascular:  Negative for chest pain and palpitations.  Gastrointestinal:  Negative for abdominal pain and vomiting.  Genitourinary:  Negative for dysuria and hematuria.  Musculoskeletal:  Negative for arthralgias and back pain.  Skin:  Negative for color change and rash.  Neurological:  Positive for headaches. Negative for seizures and syncope.  All other systems reviewed and are negative.    Physical Exam Triage Vital Signs ED Triage Vitals  Encounter Vitals Group     BP 04/03/24 1947 (!) 142/90     Girls Systolic BP Percentile --      Girls Diastolic BP Percentile --      Boys Systolic BP Percentile --      Boys Diastolic BP Percentile --      Pulse Rate 04/03/24 1947 93     Resp 04/03/24 1947 18     Temp 04/03/24 1947 98.5 F (36.9 C)     Temp Source 04/03/24 1947 Oral     SpO2 04/03/24 1947 99 %     Weight 04/03/24 1943 250 lb (113.4 kg)     Height 04/03/24 1943 5' 9 (1.753 m)     Head Circumference --      Peak Flow --      Pain Score 04/03/24 1943 0     Pain Loc --      Pain Education --      Exclude from Growth Chart --    No data found.  Updated Vital Signs BP (!) 142/90 (BP Location: Right Arm)   Pulse 93   Temp 98.5 F (36.9 C) (Oral)   Resp 18   Ht 5' 9 (1.753 m)   Wt 250 lb (113.4 kg)   LMP 03/24/2024 (Approximate)   SpO2 99%   BMI 36.92 kg/m   Visual Acuity Right Eye Distance:   Left Eye Distance:   Bilateral Distance:    Right Eye Near:   Left Eye Near:    Bilateral Near:      Physical Exam Vitals and nursing note reviewed.  Constitutional:      General: She is not in acute distress.  Appearance: She is well-developed.  HENT:     Head: Normocephalic and atraumatic.     Right Ear: Tympanic membrane normal.     Left Ear: Tympanic membrane normal.     Nose: Nasal tenderness, mucosal edema, congestion and rhinorrhea present. Rhinorrhea is purulent.     Mouth/Throat:     Mouth: Mucous membranes are moist.     Pharynx: Posterior oropharyngeal erythema present.  Eyes:     Extraocular Movements: Extraocular movements intact.     Conjunctiva/sclera: Conjunctivae normal.     Pupils: Pupils are equal, round, and reactive to light.  Cardiovascular:     Rate and Rhythm: Normal rate and regular rhythm.     Heart sounds: No murmur heard. Pulmonary:     Effort: Pulmonary effort is normal. No respiratory distress.     Breath sounds: Normal breath sounds.  Abdominal:     Palpations: Abdomen is soft.     Tenderness: There is no abdominal tenderness.  Musculoskeletal:        General: No swelling.     Cervical back: Neck supple.  Skin:    General: Skin is warm and dry.     Capillary Refill: Capillary refill takes less than 2 seconds.  Neurological:     Mental Status: She is alert.  Psychiatric:        Mood and Affect: Mood normal.      UC Treatments / Results  Labs (all labs ordered are listed, but only abnormal results are displayed) Labs Reviewed - No data to display  EKG   Radiology No results found.  Procedures Procedures (including critical care time)  Medications Ordered in UC Medications - No data to display  Initial Impression / Assessment and Plan / UC Course  I have reviewed the triage vital signs and the nursing notes.  Pertinent labs & imaging results that were available during my care of the patient were reviewed by me and considered in my medical decision making (see chart for details).     Acute non-recurrent maxillary  sinusitis   Flu A, flu B, COVID and RSV testing done at the emergency room was negative.  Symptoms and physical exam findings are most consistent with an acute maxillary sinus infection.  Due to the duration and severity of symptoms we we will treat this with antibiotic by mouth.  We will treat with the following: Augmentin  875 mg twice daily for 7 days.  This is an antibiotic.  Take this with food.  Flonase  2 sprays each nostril once daily for 7 days for nasal congestion.  May use as needed after this. Benzonatate  (tessalon ) 100 mg every 8 hours as needed for cough.   May alternate Tylenol  and ibuprofen  for pain or fevers Make sure to stay hydrated by drinking plenty of water. Return to urgent care or PCP if symptoms worsen or fail to resolve.    Final Clinical Impressions(s) / UC Diagnoses   Final diagnoses:  Acute non-recurrent maxillary sinusitis     Discharge Instructions      Flu A, flu B, COVID and RSV testing done at the emergency room was negative.  Symptoms and physical exam findings are most consistent with an acute maxillary sinus infection.  Due to the duration and severity of symptoms we we will treat this with antibiotic by mouth.  We will treat with the following: Augmentin  875 mg twice daily for 7 days.  This is an antibiotic.  Take this with food.  Flonase  2 sprays each  nostril once daily for 7 days for nasal congestion.  May use as needed after this. Benzonatate  (tessalon ) 100 mg every 8 hours as needed for cough.   May alternate Tylenol  and ibuprofen  for pain or fevers Make sure to stay hydrated by drinking plenty of water. Return to urgent care or PCP if symptoms worsen or fail to resolve.      ED Prescriptions     Medication Sig Dispense Auth. Provider   amoxicillin -clavulanate (AUGMENTIN ) 875-125 MG tablet Take 1 tablet by mouth every 12 (twelve) hours. 14 tablet Arjay Jaskiewicz A, PA-C   fluticasone  (FLONASE ) 50 MCG/ACT nasal spray Place 2 sprays into both  nostrils daily. 9.9 mL Flara Storti A, PA-C   benzonatate  (TESSALON ) 100 MG capsule Take 1 capsule (100 mg total) by mouth every 8 (eight) hours. 21 capsule Teresa Almarie LABOR, [place]      PDMP not reviewed this encounter.    [1]  Social History Tobacco Use   Smoking status: Every Day    Current packs/day: 0.50    Types: Cigarettes   Smokeless tobacco: Never  Vaping Use   Vaping status: Never Used  Substance Use Topics   Alcohol use: Yes    Comment: weekends   Drug use: Not Currently    Types: Cocaine     Teresa Almarie LABOR, PA-C 04/03/24 2018

## 2024-04-03 NOTE — ED Notes (Signed)
 Patient left AMA.

## 2024-04-03 NOTE — ED Triage Notes (Signed)
 Patient is here for URI/Cold. Seen in ED & had COVID19 and Respiratory panel and those tests were Negative, but I have had these symptoms about a week and need to know next steps. No fever.

## 2024-04-03 NOTE — Discharge Instructions (Addendum)
 Flu A, flu B, COVID and RSV testing done at the emergency room was negative.  Symptoms and physical exam findings are most consistent with an acute maxillary sinus infection.  Due to the duration and severity of symptoms we we will treat this with antibiotic by mouth.  We will treat with the following: Augmentin  875 mg twice daily for 7 days.  This is an antibiotic.  Take this with food.  Flonase  2 sprays each nostril once daily for 7 days for nasal congestion.  May use as needed after this. Benzonatate  (tessalon ) 100 mg every 8 hours as needed for cough.   May alternate Tylenol  and ibuprofen  for pain or fevers Make sure to stay hydrated by drinking plenty of water. Return to urgent care or PCP if symptoms worsen or fail to resolve.

## 2024-04-03 NOTE — ED Triage Notes (Signed)
 Patient report cough, body aches and nasal congestion x 3 days. Patient she just want to be check for covid.  Patient denies fever. Denies N/V.
